# Patient Record
Sex: Male | Born: 1945 | Race: Black or African American | Hispanic: No | Marital: Married | Smoking: Former smoker
Health system: Southern US, Community
[De-identification: ages and names within clinical notes are randomized; demographics above are authoritative.]

## PROBLEM LIST (undated history)

## (undated) DIAGNOSIS — Z86010 Personal history of colonic polyps: Secondary | ICD-10-CM

## (undated) DIAGNOSIS — I70269 Atherosclerosis of native arteries of extremities with gangrene, unspecified extremity: Secondary | ICD-10-CM

## (undated) DIAGNOSIS — I2510 Atherosclerotic heart disease of native coronary artery without angina pectoris: Secondary | ICD-10-CM

## (undated) DIAGNOSIS — N186 End stage renal disease: Secondary | ICD-10-CM

## (undated) DIAGNOSIS — M109 Gout, unspecified: Secondary | ICD-10-CM

## (undated) DIAGNOSIS — N039 Chronic nephritic syndrome with unspecified morphologic changes: Secondary | ICD-10-CM

## (undated) DIAGNOSIS — K922 Gastrointestinal hemorrhage, unspecified: Secondary | ICD-10-CM

## (undated) DIAGNOSIS — I739 Peripheral vascular disease, unspecified: Principal | ICD-10-CM

## (undated) DIAGNOSIS — Z992 Dependence on renal dialysis: Secondary | ICD-10-CM

## (undated) DIAGNOSIS — D631 Anemia in chronic kidney disease: Secondary | ICD-10-CM

## (undated) DIAGNOSIS — I10 Essential (primary) hypertension: Secondary | ICD-10-CM

## (undated) DIAGNOSIS — F4310 Post-traumatic stress disorder, unspecified: Secondary | ICD-10-CM

## (undated) DIAGNOSIS — I4891 Unspecified atrial fibrillation: Secondary | ICD-10-CM

## (undated) DIAGNOSIS — E7800 Pure hypercholesterolemia, unspecified: Secondary | ICD-10-CM

## (undated) DIAGNOSIS — H409 Unspecified glaucoma: Secondary | ICD-10-CM

## (undated) DIAGNOSIS — I350 Nonrheumatic aortic (valve) stenosis: Secondary | ICD-10-CM

## (undated) DIAGNOSIS — D62 Acute posthemorrhagic anemia: Secondary | ICD-10-CM

## (undated) DIAGNOSIS — C61 Malignant neoplasm of prostate: Secondary | ICD-10-CM

## (undated) HISTORY — DX: Unspecified glaucoma: H40.9

## (undated) HISTORY — DX: Gout, unspecified: M10.9

## (undated) HISTORY — DX: Acute posthemorrhagic anemia: D62

## (undated) HISTORY — PX: LEG AMPUTATION ABOVE KNEE: SHX117

## (undated) HISTORY — PX: AORTIC VALVE REPLACEMENT: SHX41

## (undated) HISTORY — DX: End stage renal disease: N18.6

## (undated) HISTORY — DX: Malignant neoplasm of prostate: C61

## (undated) HISTORY — DX: Dependence on renal dialysis: Z99.2

## (undated) HISTORY — DX: Gastrointestinal hemorrhage, unspecified: K92.2

## (undated) HISTORY — DX: Atherosclerotic heart disease of native coronary artery without angina pectoris: I25.10

## (undated) HISTORY — DX: Pure hypercholesterolemia, unspecified: E78.00

## (undated) HISTORY — DX: Post-traumatic stress disorder, unspecified: F43.10

## (undated) HISTORY — DX: Atherosclerosis of native arteries of extremities with gangrene, unspecified extremity: I70.269

## (undated) HISTORY — DX: Nonrheumatic aortic (valve) stenosis: I35.0

## (undated) HISTORY — DX: Anemia in chronic kidney disease: D63.1

## (undated) HISTORY — DX: Peripheral vascular disease, unspecified: I73.9

## (undated) HISTORY — DX: Unspecified atrial fibrillation: I48.91

## (undated) HISTORY — PX: OTHER SURGICAL HISTORY: SHX169

## (undated) HISTORY — DX: Essential (primary) hypertension: I10

## (undated) HISTORY — DX: Personal history of colonic polyps: Z86.010

## (undated) HISTORY — DX: Chronic nephritic syndrome with unspecified morphologic changes: N03.9

---

## 2013-01-29 ENCOUNTER — Non-Acute Institutional Stay (SKILLED_NURSING_FACILITY): Payer: PRIVATE HEALTH INSURANCE | Admitting: Internal Medicine

## 2013-01-29 DIAGNOSIS — N185 Chronic kidney disease, stage 5: Secondary | ICD-10-CM

## 2013-01-29 DIAGNOSIS — I70269 Atherosclerosis of native arteries of extremities with gangrene, unspecified extremity: Secondary | ICD-10-CM

## 2013-01-29 DIAGNOSIS — I4891 Unspecified atrial fibrillation: Secondary | ICD-10-CM

## 2013-02-02 ENCOUNTER — Other Ambulatory Visit: Payer: Self-pay | Admitting: Geriatric Medicine

## 2013-02-02 MED ORDER — FENTANYL 50 MCG/HR TD PT72: 1.0000 | MEDICATED_PATCH | TRANSDERMAL | Status: DC

## 2013-02-04 NOTE — Progress Notes (Signed)
Patient ID: Stephen Dyer, male   DOB: Feb 17, 1946, 67 y.o.   MRN: [id number]           HISTORY & PHYSICAL  DATE:  01/28/2013    FACILITY: [facility]   LEVEL OF CARE:   SNF   CHIEF COMPLAINT:  Admission to SNF, status post admission to [facility], 01/11/2013 through 01/27/2013.    HISTORY OF PRESENT ILLNESS:  This is a 67 year-old man with a history of bilateral carotid stenosis, type 2 diabetes, aortic valve replacement, end-stage renal disease, on peritoneal dialysis, and known PVD, who was admitted for a nonhealing foot ulcer with PAD.  He was status post a very proximal left AKA on April 4th for a nonhealing ulcer.  He developed significant pain in his right foot with linear ulceration on the plantar aspect of the right great toe.  His arteriogram initially suggested the possibility of revascularization.  He had an arteriogram and then was taken to the OR to attempt a femoral to distal bypass.  However, this was aborted intraoperatively since the posterior tibial artery was too calcified.  Tentative discharge plans were to send him to the [place] with hospice care and Dilaudid for better pain control.   However, the [place] does not accommodate any dialysis with hospice patients.  Therefore, the patient was sent to SNF.    PAST MEDICAL HISTORY/PROBLEM LIST:  Advanced peripheral artery disease without any revascularization.    Ischemic wounds involving his right forefoot, especially laterally, the right medial ankle and the previous left above-knee amputation site.    End-stage renal disease, on dialysis.  Had previously been on peritoneal dialysis for four years.    Bilateral carotid stenosis, status post left carotid endarterectomy.    Tobacco abuse.    Claudication.    Aortic stenosis, status post aortic valve replacement.    Hypertension.    Anemia of chronic renal failure.    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     Obstructive sleep apnea, on CPAP.    SOCIAL HISTORY: I have very little information.  The patient's wife is present.    FAMILY HISTORY:   None, according to the patient.    REVIEW OF SYSTEMS:   CHEST/RESPIRATORY:  He is not complaining of shortness of breath.  Ex-smoker, quitting many years ago.   CARDIAC:   No chest pain.  GI:  No abdominal pain.  No diarrhea.   MUSCULOSKELETAL:  Extremities:  Complains of 4/10 pain, mostly in his distal right foot.     PHYSICAL EXAMINATION:   GENERAL APPEARANCE:  Pleasant, cooperative man in no distress.   HEENT:  MOUTH/THROAT:  No lesions seen.  CHEST/RESPIRATORY:  Clear air entry bilaterally.  No wheezing.  Essentially normal exam.   CARDIOVASCULAR:  CARDIAC:   Atrial fibrillation.  There are no murmurs.  No carotid bruits.   GASTROINTESTINAL:  ABDOMEN:   He has a peritoneal dialysis catheter.   LIVER/SPLEEN/KIDNEYS:  No liver, no spleen.  No tenderness.  No masses.   MUSCULOSKELETAL:  EXTREMITIES:   RIGHT LOWER EXTREMITY:  Right leg:  There is an incision in his right groin area and also the medial aspect of his right ankle.  The right groin incision is clean and was stapled.  He does not have a popliteal pulse, but surprisingly I thought I could feel a faint dorsalis pedis pulse.  The incision on the medial aspect of his right  ankle is necrotic.  He has a necrotic eschar on the dorsal aspect of his foot on the right third, fourth and fifth toes that has broken down.  There are ulcers, denuded skin between the fourth and fifth toes.  This is painful.  The medial ankle is also necrotic.  I think the incision, which still has its sutures in place, will eventually become dehisced.   LEFT LOWER EXTREMITY:  The left above-knee amputation site has at least a quarter of the wound covered in a sizeable necrotic eschar.  This will ultimately need to be removed.  Whether this site is salvageable or not really depends on what this looks like underneath.   Stump revision on this site is certainly not impossible.  I do not think there is any active infection at this site or in the right foot, although he is certainly at risk for all of the above.    ASSESSMENT/PLAN:  Chronic renal failure, now on dialysis through the right subclavian.  He has a PICC line in, which I think they were using for antibiotics, which I will maintain for now.   Atrial fibrillation.  Rate is controlled on Coumadin.   Severe peripheral vascular disease with gangrenous change in the right forefoot, right medial ankle, and change in the left amputation site.  This man was  apparently felt to be too unstable for further surgery.  Unfortunately, I think further surgery is going to be necessary at some point, probably to include a right BKA or UKA.  He will need revision of the left stump.  He is not a diabetic.    Hyperlipidemia.  Gastroesophageal reflux disease.    Secondary hyperparathyroidism.    Anemia of chronic renal failure.  On Aranesp.    Probably poorly tolerated hemodialysis.  On Midodrine.    Chronic pain.  On a combination of Fentanyl and hydromorphone.    The issue of hospice has come up on this man.  However, he is Full Code and on dialysis.  I do not see any room for hospice care here.    CPT CODE: 11914

## 2013-02-07 ENCOUNTER — Other Ambulatory Visit: Payer: Self-pay | Admitting: *Deleted

## 2013-02-07 MED ORDER — HYDROMORPHONE HCL 8 MG PO TABS: ORAL_TABLET | ORAL | Status: DC

## 2013-02-11 ENCOUNTER — Other Ambulatory Visit: Payer: Self-pay | Admitting: *Deleted

## 2013-02-11 MED ORDER — FENTANYL 75 MCG/HR TD PT72: MEDICATED_PATCH | TRANSDERMAL | Status: DC

## 2013-02-12 ENCOUNTER — Non-Acute Institutional Stay (SKILLED_NURSING_FACILITY): Payer: PRIVATE HEALTH INSURANCE | Admitting: Internal Medicine

## 2013-02-12 DIAGNOSIS — N185 Chronic kidney disease, stage 5: Secondary | ICD-10-CM

## 2013-02-12 DIAGNOSIS — I70269 Atherosclerosis of native arteries of extremities with gangrene, unspecified extremity: Secondary | ICD-10-CM

## 2013-02-24 NOTE — Progress Notes (Signed)
Patient ID: Stephen Dyer, male   DOB: Aug 30, 1946, 67 y.o.   MRN: [id number]           PROGRESS NOTE  DATE:  02/11/2013  FACILITY: [facility]   LEVEL OF CARE:   SNF   Acute Visit   CHIEF COMPLAINT:  Follow up right foot, left above-knee amputation.    HISTORY OF PRESENT ILLNESS:  Mr. Formisano is a gentleman who came to [place] earlier this month from the [place] in [place].  He has end-stage renal disease, on dialysis now through a right subclavian catheter, previously on peritoneal dialysis.    He has severe PAD with a necrotic wound on his right ankle/lower leg and a failed attempt at revascularization.  He has also had a left above-knee amputation which is covered with a necrotic eschar in the center.    When I saw him two weeks ago, I believe I thought that he was evidently going back to the [place] for further surgical consultation.  As I understand things now, that assumption was wrong.  He is  apparently not planned for any further surgery.  The patient tells me that he had either a postoperative heart attack and some form of hemorrhage when they did the left above-knee amputation.  He also has a large number of comorbidities including bilateral carotid stenosis, coronary artery stenosis, status post aortic valve replacement.      The patient reports increasing pain, currently on Duragesic 50 mcg q.72 as well as Dilaudid 8 mg q.3 which he takes religiously.     REVIEW OF SYSTEMS:   CHEST/RESPIRATORY:  No shortness of breath.  He has oxygen on.   CARDIAC:   No clear chest pain.   GI:  No clear abdominal pain or diarrhea.   MUSCULOSKELETAL:  Increasing pain in the right lower extremity, especially the forefoot.    PHYSICAL EXAMINATION:   SKIN:  INSPECTION:  Since I last saw this gentleman two weeks ago, the condition of his right forefoot has deteriorated.  His right great toe looks as though it is mummified.  There is also I think advancing necrosis through most of his toes, especially  posteriorly.  The area on his right medial lower extremity appears to be stable.  This was an operative site.  It is covered with necrotic eschar with the staples still in place.  This is nowhere close to healing and would need further debridement.    The area on the left above-knee amputation site is also covered with a thick, necrotic eschar which needs further debridement.  Once again, the staples are still in place here.  ASSESSMENT/PLAN:  Gangrene of the right lower extremity.  The situation in his right forefoot appears to have deteriorated in the last two weeks, which is malodorous.  I think the entire area is not going to be easy to deal with and, if this continues at this rate, we may be forced into a difficult decision in the next several weeks.  I think the best option for this would be a right above-knee amputation.  The patient tells me he is not a surgical candidate, although this area may force a decision otherwise.  Failing this, hospice care would certainly be reasonable.  Stopping dialysis at some point would also seem a reasonable decision, +/- hospice care.    With regards to the left above-knee amputation site, there is necrotic eschar here.  I probably could debride this in the facility and see if we can do  anything about healing this.  However, I would like some plan about the right leg before I go to this extent.   I will wait to discuss things further with his wife, who is  apparently returning to the facility as we speak.     CPT CODE: 47829  ADDENDUM:  I think I actually thought this gentleman was returning to the [place] imminently and did not order very much on him.  I will increase his narcotics, Duragesic to 75 q.d.  I will check his lab work.  I would urge a decision here.  I will do my best to facilitate this.

## 2013-03-08 ENCOUNTER — Other Ambulatory Visit: Payer: Self-pay | Admitting: *Deleted

## 2013-03-08 MED ORDER — OXYCODONE HCL 10 MG PO TABS: ORAL_TABLET | ORAL | Status: DC

## 2013-03-10 ENCOUNTER — Non-Acute Institutional Stay (SKILLED_NURSING_FACILITY): Payer: PRIVATE HEALTH INSURANCE | Admitting: Adult Health

## 2013-03-10 DIAGNOSIS — I739 Peripheral vascular disease, unspecified: Secondary | ICD-10-CM

## 2013-03-10 DIAGNOSIS — F17200 Nicotine dependence, unspecified, uncomplicated: Secondary | ICD-10-CM | POA: Insufficient documentation

## 2013-03-10 DIAGNOSIS — H409 Unspecified glaucoma: Secondary | ICD-10-CM

## 2013-03-10 DIAGNOSIS — F4323 Adjustment disorder with mixed anxiety and depressed mood: Secondary | ICD-10-CM | POA: Insufficient documentation

## 2013-03-10 DIAGNOSIS — M109 Gout, unspecified: Secondary | ICD-10-CM

## 2013-03-10 DIAGNOSIS — I359 Nonrheumatic aortic valve disorder, unspecified: Secondary | ICD-10-CM

## 2013-03-10 DIAGNOSIS — E785 Hyperlipidemia, unspecified: Secondary | ICD-10-CM

## 2013-03-10 DIAGNOSIS — Z992 Dependence on renal dialysis: Secondary | ICD-10-CM

## 2013-03-10 DIAGNOSIS — Z860100 Personal history of colon polyps, unspecified: Secondary | ICD-10-CM

## 2013-03-10 DIAGNOSIS — Z952 Presence of prosthetic heart valve: Secondary | ICD-10-CM

## 2013-03-10 DIAGNOSIS — Z954 Presence of other heart-valve replacement: Secondary | ICD-10-CM

## 2013-03-10 DIAGNOSIS — E213 Hyperparathyroidism, unspecified: Secondary | ICD-10-CM

## 2013-03-10 DIAGNOSIS — F4310 Post-traumatic stress disorder, unspecified: Secondary | ICD-10-CM

## 2013-03-10 DIAGNOSIS — C61 Malignant neoplasm of prostate: Secondary | ICD-10-CM

## 2013-03-10 DIAGNOSIS — N186 End stage renal disease: Secondary | ICD-10-CM

## 2013-03-10 DIAGNOSIS — I951 Orthostatic hypotension: Secondary | ICD-10-CM

## 2013-03-10 DIAGNOSIS — I350 Nonrheumatic aortic (valve) stenosis: Secondary | ICD-10-CM

## 2013-03-10 DIAGNOSIS — I10 Essential (primary) hypertension: Secondary | ICD-10-CM

## 2013-03-10 DIAGNOSIS — K219 Gastro-esophageal reflux disease without esophagitis: Secondary | ICD-10-CM

## 2013-03-10 DIAGNOSIS — I679 Cerebrovascular disease, unspecified: Secondary | ICD-10-CM | POA: Insufficient documentation

## 2013-03-10 HISTORY — DX: Essential (primary) hypertension: I10

## 2013-03-10 HISTORY — DX: Personal history of colonic polyps: Z86.010

## 2013-03-10 HISTORY — DX: Peripheral vascular disease, unspecified: I73.9

## 2013-03-10 HISTORY — DX: Unspecified glaucoma: H40.9

## 2013-03-10 HISTORY — DX: Dependence on renal dialysis: Z99.2

## 2013-03-10 HISTORY — DX: Personal history of colon polyps, unspecified: Z86.0100

## 2013-03-10 HISTORY — DX: Malignant neoplasm of prostate: C61

## 2013-03-10 HISTORY — DX: Gout, unspecified: M10.9

## 2013-03-10 HISTORY — DX: Post-traumatic stress disorder, unspecified: F43.10

## 2013-03-10 HISTORY — DX: End stage renal disease: N18.6

## 2013-03-11 ENCOUNTER — Other Ambulatory Visit: Payer: Self-pay | Admitting: Geriatric Medicine

## 2013-03-11 MED ORDER — OXYCODONE HCL 10 MG PO TABS: ORAL_TABLET | ORAL | Status: DC

## 2013-03-14 ENCOUNTER — Non-Acute Institutional Stay (SKILLED_NURSING_FACILITY): Payer: PRIVATE HEALTH INSURANCE | Admitting: Internal Medicine

## 2013-03-14 DIAGNOSIS — N039 Chronic nephritic syndrome with unspecified morphologic changes: Secondary | ICD-10-CM

## 2013-03-14 DIAGNOSIS — I70269 Atherosclerosis of native arteries of extremities with gangrene, unspecified extremity: Secondary | ICD-10-CM

## 2013-03-14 DIAGNOSIS — E7800 Pure hypercholesterolemia, unspecified: Secondary | ICD-10-CM

## 2013-03-14 DIAGNOSIS — D631 Anemia in chronic kidney disease: Secondary | ICD-10-CM

## 2013-03-14 DIAGNOSIS — I350 Nonrheumatic aortic (valve) stenosis: Secondary | ICD-10-CM

## 2013-03-14 DIAGNOSIS — I359 Nonrheumatic aortic valve disorder, unspecified: Secondary | ICD-10-CM

## 2013-03-28 ENCOUNTER — Non-Acute Institutional Stay (SKILLED_NURSING_FACILITY): Payer: PRIVATE HEALTH INSURANCE | Admitting: Internal Medicine

## 2013-03-28 DIAGNOSIS — I4891 Unspecified atrial fibrillation: Secondary | ICD-10-CM

## 2013-03-28 DIAGNOSIS — R110 Nausea: Secondary | ICD-10-CM

## 2013-03-29 ENCOUNTER — Non-Acute Institutional Stay (SKILLED_NURSING_FACILITY): Payer: PRIVATE HEALTH INSURANCE | Admitting: Internal Medicine

## 2013-03-29 DIAGNOSIS — I70269 Atherosclerosis of native arteries of extremities with gangrene, unspecified extremity: Secondary | ICD-10-CM

## 2013-03-29 DIAGNOSIS — N185 Chronic kidney disease, stage 5: Secondary | ICD-10-CM

## 2013-03-29 DIAGNOSIS — I4891 Unspecified atrial fibrillation: Secondary | ICD-10-CM

## 2013-04-07 DIAGNOSIS — E7800 Pure hypercholesterolemia, unspecified: Secondary | ICD-10-CM

## 2013-04-07 DIAGNOSIS — I350 Nonrheumatic aortic (valve) stenosis: Secondary | ICD-10-CM | POA: Insufficient documentation

## 2013-04-07 DIAGNOSIS — I70269 Atherosclerosis of native arteries of extremities with gangrene, unspecified extremity: Secondary | ICD-10-CM

## 2013-04-07 DIAGNOSIS — D631 Anemia in chronic kidney disease: Secondary | ICD-10-CM | POA: Insufficient documentation

## 2013-04-07 DIAGNOSIS — N039 Chronic nephritic syndrome with unspecified morphologic changes: Secondary | ICD-10-CM | POA: Insufficient documentation

## 2013-04-07 HISTORY — DX: Atherosclerosis of native arteries of extremities with gangrene, unspecified extremity: I70.269

## 2013-04-07 HISTORY — DX: Anemia in chronic kidney disease: D63.1

## 2013-04-07 HISTORY — DX: Nonrheumatic aortic (valve) stenosis: I35.0

## 2013-04-07 HISTORY — DX: Pure hypercholesterolemia, unspecified: E78.00

## 2013-04-07 NOTE — Progress Notes (Signed)
Patient ID: Stephen Dyer, male   DOB: 11-Jul-1946, 67 y.o.   MRN: [id number]        HISTORY & PHYSICAL  DATE: 03/14/2013   FACILITY: [facility]  LEVEL OF CARE: SNF (31)  ALLERGIES:   Doxercalciferol.    Gabapentin.    Sulfamethoxazole.  Dilantin.   Sensipar.    Icodextrin.    CHIEF COMPLAINT:  Manage right lower extremity ischemia, aortic stenosis, and hyperlipidemia.    HISTORY OF PRESENT ILLNESS:  The patient is a 67 year-old, African-American male.    RIGHT LOWER EXTREMITY ISCHEMIA:  Patient has severe peripheral arterial disease and is status post left AKA.  He was having critical ischemia of the right lower extremity and failed a bypass attempt.  He was having a nonpalliative surgical wound and dry gangrene.  He started having significant pain and worsening ischemia as well as progression of nonpalliative wound.  Therefore, he eventually underwent right AKA and tolerated the procedure well.  He is admitted to this facility for short-term rehabilitation.  He denies any pain currently.    AORTIC STENOSIS: Status post mechanical aortic valve replacement.  The aortic stenosis remains stable.  Patient denies SOB, DOE, dizziness or syncopal episodes.  Patient is currently being followed by the cardiologist.   HYPERLIPIDEMIA: No complications from the medications presently being used. Last fasting lipid panel showed : A recent lipid panel is not available.    PAST MEDICAL HISTORY :   Aortic valve stenosis, status post mechanical aortic valve replacement.   Hypertension.    Hyperlipidemia.    Gout.   End-stage renal disease, on hemodialysis.   Cerebrovascular disease.   History of prostate cancer.   Posttraumatic stress disorder.   Adjustment disorder with mixed anxiety and depressed mood.    Anemia of chronic kidney disease.    Peripheral vascular disease.   Tobacco use.    Colonic polyps.    Sensorineural hearing loss.    Carotid artery  occlusion.    GERD.   Pre-glaucoma presbyopia.   PAST SURGICAL HISTORY: None  SOCIAL HISTORY: TOBACCO USE:  He used to abuse tobacco, but has quit that practice now.   ALCOHOL:   He used to abuse alcohol, but has quit that practice now.  ILLICIT DRUGS:  He used to abuse illicit drugs, mainly marijuana, but has quit that practice now.  FAMILY HISTORY:  MOTHER/FATHER:  Parents are deceased.   SIBLINGS:  Sister has lupus and diabetes mellitus.    CURRENT MEDICATIONS: Reviewed per [facility]  REVIEW OF SYSTEMS:  See HPI otherwise 14 point ROS is negative.  PHYSICAL EXAMINATION  VS:  T 97.9       P 64      RR 18      BP 100/60      POX 95%        WT (Lb)  GENERAL: no acute distress, normal body habitus EYES: conjunctivae normal, sclerae normal, normal eye lids MOUTH/THROAT: lips without lesions,no lesions in the mouth,tongue is without lesions,uvula elevates in midline NECK: supple, trachea midline, no neck masses, no thyroid tenderness, no thyromegaly LYMPHATICS: no LAN in the neck, no supraclavicular LAN RESPIRATORY: breathing is even & unlabored, BS CTAB CARDIAC: RRR, no murmur,no extra heart sounds, no edema GI:  ABDOMEN: abdomen soft, normal BS, no masses, no tenderness  LIVER/SPLEEN: no hepatomegaly, no splenomegaly MUSCULOSKELETAL: HEAD: normal to inspection & palpation BACK: no kyphosis, scoliosis or spinal processes tenderness EXTREMITIES: LEFT UPPER EXTREMITY: full range of  motion, normal strength & tone RIGHT UPPER EXTREMITY:  full range of motion, normal strength & tone LEFT LOWER EXTREMITY: bilateral AKA RIGHT LOWER EXTREMITY: bilateral AKA PSYCHIATRIC: the patient is alert & oriented to person, affect & behavior appropriate  LABS/RADIOLOGY: Hemoglobin 10.3, MCV 87, otherwise CBC normal.  ASSESSMENT/PLAN:  Right lower extremity ischemia.  Status post right AKA.  Continue wound treatment.    Aortic stenosis.  Status post aortic valve replacement.     Hyperlipidemia.  Continue statin.   Anemia of chronic kidney disease.  Reassess hemoglobin level.    End-stage renal disease.  Continue hemodialysis.    Gout.  Continue allopurinol.    Check CBC and BMP.   I have reviewed patient's medical records received at admission/from hospitalization.  CPT CODE: 95284

## 2013-04-11 ENCOUNTER — Non-Acute Institutional Stay (SKILLED_NURSING_FACILITY): Payer: PRIVATE HEALTH INSURANCE | Admitting: Adult Health

## 2013-04-11 DIAGNOSIS — N186 End stage renal disease: Secondary | ICD-10-CM

## 2013-04-11 DIAGNOSIS — Z952 Presence of prosthetic heart valve: Secondary | ICD-10-CM

## 2013-04-11 DIAGNOSIS — Z992 Dependence on renal dialysis: Secondary | ICD-10-CM

## 2013-04-11 DIAGNOSIS — I350 Nonrheumatic aortic (valve) stenosis: Secondary | ICD-10-CM

## 2013-04-11 DIAGNOSIS — Z954 Presence of other heart-valve replacement: Secondary | ICD-10-CM

## 2013-04-11 DIAGNOSIS — E7800 Pure hypercholesterolemia, unspecified: Secondary | ICD-10-CM

## 2013-04-11 DIAGNOSIS — E213 Hyperparathyroidism, unspecified: Secondary | ICD-10-CM

## 2013-04-11 DIAGNOSIS — I4891 Unspecified atrial fibrillation: Secondary | ICD-10-CM

## 2013-04-11 DIAGNOSIS — I359 Nonrheumatic aortic valve disorder, unspecified: Secondary | ICD-10-CM

## 2013-04-11 DIAGNOSIS — R634 Abnormal weight loss: Secondary | ICD-10-CM

## 2013-04-11 DIAGNOSIS — M109 Gout, unspecified: Secondary | ICD-10-CM

## 2013-04-11 DIAGNOSIS — I951 Orthostatic hypotension: Secondary | ICD-10-CM

## 2013-04-11 DIAGNOSIS — I739 Peripheral vascular disease, unspecified: Secondary | ICD-10-CM

## 2013-04-19 NOTE — Progress Notes (Signed)
Patient ID: Stephen Dyer, male   DOB: 1946/05/07, 67 y.o.   MRN: [id number]           HISTORY & PHYSICAL  DATE:  03/29/2013    [facility]ACILITY[facility]   LEVEL OF CARE:   SNF   Acute Visit   CHIEF COMPLAINT:  Review of lower extremity wounds, etc.    HISTORY OF PRESENT ILLNESS:  Mr. Nepomuceno is a gentleman who came to [place] initially from [facility].  He had had an attempt at revascularization of a gangrenous change in his left leg.  He also had a necrotic area on a previous left above-knee amputation.  We were eventually able to send him back to the [place] in [place] for a right above-knee amputation and revision of the left stump.  I do not have much information on this.  However, he appears to have been readmitted to the facility on 03/14/2013.    He was readmitted to hospital on 03/17/2013 with atrial fibrillation with rapid ventricular response.  The patient received a beta blocker with good improvement in his heart rate.  He received oral ciprofloxacin for a stump culture that grew gram-negative rods.    PAST MEDICAL HISTORY:  Peripheral vascular disease, now with bilateral above-knee amputations, right above-knee amputation after the left and revision of the left stump.    End-stage renal disease, on hemodialysis through a right subclavian line, having previously been on peritoneal dialysis for years.    Bilateral carotid stenosis, status post left carotid endarterectomy.    History of severe PAD.    Aortic stenosis, status post aortic valve replacement which I think is prosthetic.    Hypertension.    Anemia of chronic renal failure.    Obstructive sleep apnea, on CPAP.    CURRENT MEDICATIONS:  Medication list is reviewed.    His INR today is 1.8.  He is on Coumadin 6.5 mg.  There are orders from his nephrologist, as well.    REVIEW OF SYSTEMS:   CHEST/RESPIRATORY:  He is not complaining of shortness of breath.  On chronic oxygen.  He is an ex-smoker,  quitting many years ago.   CARDIAC:   No current chest pain.  GI:  No abdominal pain.  No diarrhea.  MUSCULOSKELETAL:  Status post bilateral above-knee amputations now.     PHYSICAL EXAMINATION:   VITAL SIGNS:   O2 SATURATIONS:  Pulse ox 91% on 2 L.  RESPIRATIONS:  18.   PULSE:  100.   CHEST/RESPIRATORY:  Shallow, but otherwise clear air entry.  CARDIOVASCULAR:  CARDIAC:  AFib.  There is a 2/6 mitral regurge type murmur at the lower left sternal border that does not radiate.  This could also be tricuspid regurge.   GASTROINTESTINAL:  ABDOMEN:   He has a peritoneal dialysis catheter in place.   LIVER/SPLEEN/KIDNEYS:  No liver, no spleen.  No tenderness.   SKIN:  INSPECTION:   Extremities:  I did look at his left stump wound which is much better, a clean granulating wound.  Silver alginate to this.    ASSESSMENT/PLAN:  Atrial fibrillation.  His pulse rate is in the high 90s.  It does not appear that he is on any medications for this.  States he was on a beta blocker at [facility].  I do not see this.  He is already on Coumadin, followed in-house.    History of end-stage renal disease, on hemodialysis currently through a right subclavian line.  History of now bilateral lower extremity amputations.  I will review both of his stumps later this week.  However, the necrotic wound on the left stump, I think has been revised and now looks on its way to healing.    Status post aortic valve replacement.  There is no evidence of heart failure here.  I suspect this is a tissue valve.  I wonder whether he had some degree of pulmonary hypertension as the second sound was fairly brisk.    He is on midodrine.  I wonder how well he tolerates this.    I will be back later in the week to check the wounds on his lower extremities.    CPT CODE: 16109

## 2013-04-20 NOTE — Progress Notes (Signed)
Patient ID: Stephen Dyer, male   DOB: 31-Jan-1946, 67 y.o.   MRN: [id number]        PROGRESS NOTE  DATE:  03/28/2013  FACILITY:  [facility]  LEVEL OF CARE: SNF (31)  Acute Visit  CHIEF COMPLAINT:  Manage nausea and tachycardia.    HISTORY OF PRESENT ILLNESS: I was requested by the staff to assess the patient regarding above problem(s):  NAUSEA:  Staff report that patient is having frequent nausea, and patient does admit to nausea in the evenings and at night.  He denies vomiting, diarrhea, or constipation.    He cannot identify precipitating factors.  Alleviated by Zofran.    TACHYCARDIA:  Patient is complaining of frequent elevated heart rate, but denies shortness of breath or chest pain.  On review of pulse log, patient only has one elevation to 111 on 03/24/2013.  Other times, pulse is normal.    PAST MEDICAL HISTORY : Reviewed.  No changes.  CURRENT MEDICATIONS: Reviewed per [facility]  REVIEW OF SYSTEMS:  GENERAL: no change in appetite, no fatigue, no weight changes, no fever, chills or weakness RESPIRATORY: no cough, SOB, DOE,, wheezing, hemoptysis CARDIAC: no chest pain, edema or palpitations GI: no abdominal pain, diarrhea, constipation, heart burn, or vomiting; complains of nausea  PHYSICAL EXAMINATION  VS:  T 98.2       P 76      RR 16      BP 122/68     POX %       WT (Lb)  GENERAL: no acute distress, normal body habitus NECK: supple, trachea midline, no neck masses, no thyroid tenderness, no thyromegaly RESPIRATORY: breathing is even & unlabored, BS CTAB CARDIAC: RRR, no murmur,no extra heart sounds, no edema GI: abdomen soft, normal BS, no masses, no tenderness, no hepatomegaly, no splenomegaly PSYCHIATRIC: the patient is alert & oriented to person, affect & behavior appropriate  ASSESSMENT/PLAN:  Nausea.  New problem.  Start Zofran 4 mg  t.i.d. p.r.n.   Atrial fibrillation.  Rate controlled.  No persistent tachycardia.      CPT CODE: 40981

## 2013-04-21 DIAGNOSIS — R110 Nausea: Secondary | ICD-10-CM | POA: Insufficient documentation

## 2013-04-21 DIAGNOSIS — I4891 Unspecified atrial fibrillation: Secondary | ICD-10-CM | POA: Insufficient documentation

## 2013-04-21 HISTORY — DX: Unspecified atrial fibrillation: I48.91

## 2013-05-31 ENCOUNTER — Other Ambulatory Visit: Payer: Self-pay | Admitting: *Deleted

## 2013-05-31 MED ORDER — OXYCODONE HCL 10 MG PO TABS: ORAL_TABLET | ORAL | Status: DC

## 2013-06-15 ENCOUNTER — Other Ambulatory Visit: Payer: Self-pay | Admitting: *Deleted

## 2013-06-15 MED ORDER — OXYCODONE HCL 10 MG PO TABS: ORAL_TABLET | ORAL | Status: DC

## 2013-06-16 ENCOUNTER — Non-Acute Institutional Stay (SKILLED_NURSING_FACILITY): Payer: PRIVATE HEALTH INSURANCE | Admitting: Internal Medicine

## 2013-06-16 DIAGNOSIS — I4891 Unspecified atrial fibrillation: Secondary | ICD-10-CM

## 2013-06-16 DIAGNOSIS — I2510 Atherosclerotic heart disease of native coronary artery without angina pectoris: Secondary | ICD-10-CM

## 2013-06-16 DIAGNOSIS — K922 Gastrointestinal hemorrhage, unspecified: Secondary | ICD-10-CM

## 2013-06-16 DIAGNOSIS — D62 Acute posthemorrhagic anemia: Secondary | ICD-10-CM

## 2013-06-27 ENCOUNTER — Non-Acute Institutional Stay (SKILLED_NURSING_FACILITY): Payer: PRIVATE HEALTH INSURANCE | Admitting: Internal Medicine

## 2013-06-27 DIAGNOSIS — D62 Acute posthemorrhagic anemia: Secondary | ICD-10-CM

## 2013-07-02 DIAGNOSIS — D62 Acute posthemorrhagic anemia: Secondary | ICD-10-CM

## 2013-07-02 HISTORY — DX: Acute posthemorrhagic anemia: D62

## 2013-07-02 NOTE — Progress Notes (Signed)
PROGRESS NOTE  DATE: 06/27/2013  FACILITY:  [facility]  LEVEL OF CARE: SNF (31)  Acute Visit  CHIEF COMPLAINT:  Manage  HISTORY OF PRESENT ILLNESS: I was requested by the staff to assess the patient regarding above problem(s):  ANEMIA: The anemia has been stable. The patient denies fatigue, melena or hematochezia.  And 06-23-13 hemoglobin 9, MCV 90. Prior hemoglobin was 9.1.  PAST MEDICAL HISTORY : Reviewed.  No changes.  CURRENT MEDICATIONS: Reviewed per [facility]  PHYSICAL EXAMINATION  GENERAL: no acute distress, normal body habitus RESPIRATORY: breathing is even & unlabored, BS CTAB CARDIAC: RRR, no murmur,no extra heart sounds, no edema  LABS/RADIOLOGY: See history of present illness  ASSESSMENT/PLAN:  Acute blood loss anemia -- hemoglobin stable.  CPT CODE: 86578

## 2013-07-08 ENCOUNTER — Other Ambulatory Visit: Payer: Self-pay | Admitting: *Deleted

## 2013-07-08 MED ORDER — OXYCODONE HCL 10 MG PO TABS: ORAL_TABLET | ORAL | Status: DC

## 2013-07-08 NOTE — Telephone Encounter (Signed)
rx filled per protocol  

## 2013-07-19 DIAGNOSIS — I2510 Atherosclerotic heart disease of native coronary artery without angina pectoris: Secondary | ICD-10-CM | POA: Insufficient documentation

## 2013-07-19 DIAGNOSIS — K922 Gastrointestinal hemorrhage, unspecified: Secondary | ICD-10-CM | POA: Insufficient documentation

## 2013-07-19 HISTORY — DX: Atherosclerotic heart disease of native coronary artery without angina pectoris: I25.10

## 2013-07-19 HISTORY — DX: Gastrointestinal hemorrhage, unspecified: K92.2

## 2013-07-19 NOTE — Progress Notes (Signed)
Patient ID: Stephen Dyer, male   DOB: 02-Sep-1946, 67 y.o.   MRN: [id number]        HISTORY & PHYSICAL  DATE: 06/16/2013   FACILITY: [facility]  LEVEL OF CARE: SNF (31)  ALLERGIES:   Dilantin.    Losartan.    Hydrochlorothiazide.    Sulfa.    Vancomycin.    Atorvastatin.    Gabapentin.    Sensipar.    Hectorol.    Icodextrin.    CHIEF COMPLAINT:  Manage acute GI bleed, atrial fibrillation, and CAD.    HISTORY OF PRESENT ILLNESS:  The patient is a 67 year-old, African-American male who was hospitalized secondary to acute GI bleed.  He was supratherapeutic on his INR and was hemoccult positive.  EGD showed Mallory-Weiss tear at the GE junction with no signs of active bleeding.    He required 4 U of packed red blood cells.  Hemoglobin stabilized at 9.1.  The patient is readmitted back to the facility for short-term rehabilitation.  He denies ongoing GI bleeding.    ATRIAL FIBRILLATION: the patients atrial fibrillation remains stable.  The patient denies DOE, tachycardia, orthopnea, transient neurological sx, pedal edema, palpitations, & PNDs.  No complications noted from the medications currently being used.    CAD: The angina has been stable. The patient denies dyspnea on exertion, orthopnea, pedal edema, palpitations and paroxysmal nocturnal dyspnea. No complications noted from the medication presently being used.    PAST MEDICAL HISTORY :   Atrial fibrillation.    Aortic valve replacement with a [employer] mechanical valve.    CAD.    Anemia of chronic disease.    End-stage renal disease, on hemodialysis.    Hyperlipidemia.    Gout.    PAST SURGICAL HISTORY:  Bilateral AKA.     Aortic valve replacement.    SOCIAL HISTORY: TOBACCO USE:  No current tobacco use.  ALCOHOL:  No current alcohol use.  ILLICIT DRUGS:  No current illicit drug use.   HOUSING:   He was a resident of this facility prior to hospitalization.    FAMILY HISTORY:    Hypertension.    CURRENT MEDICATIONS: Reviewed per [facility]  REVIEW OF SYSTEMS:   GI:  Complains of constipation.    See HPI otherwise 14 point ROS is negative.  PHYSICAL EXAMINATION  VS:  T 96.8       P 68      RR 18      BP 90/74      POX 96%        WT (Lb) 148.5    GENERAL: no acute distress, normal body habitus EYES: conjunctivae normal, sclerae normal, normal eye lids MOUTH/THROAT: lips without lesions,no lesions in the mouth,tongue is without lesions,uvula elevates in midline NECK: supple, trachea midline, no neck masses, no thyroid tenderness, no thyromegaly LYMPHATICS: no LAN in the neck, no supraclavicular LAN RESPIRATORY: breathing is even & unlabored, BS CTAB CARDIAC: heart rate is irregular irregular, no murmur,no extra heart sounds, no edema GI:  ABDOMEN: abdomen soft, normal BS, no masses, no tenderness  LIVER/SPLEEN: no hepatomegaly, no splenomegaly MUSCULOSKELETAL: HEAD: normal to inspection & palpation BACK: no kyphosis, scoliosis or spinal processes tenderness EXTREMITIES: LEFT UPPER EXTREMITY: full range of motion, normal strength & tone RIGHT UPPER EXTREMITY:  full range of motion, normal strength & tone LEFT LOWER EXTREMITY:  AKA   RIGHT LOWER EXTREMITY: AKA   PSYCHIATRIC: the patient is alert & oriented to person, affect & behavior  appropriate  LABS/RADIOLOGY: Serum iron level 57, TIBC 53, percent saturation 23, vitamin B12 497, RBC folate 1,439.    Hemoglobin A1c 5.3.    WBC 8.1, hemoglobin 9.1.    Sodium 133, potassium 4.1, BUN 47, creatinine 8.17.    INR 3.11.    ASSESSMENT/PLAN:  Acute GI bleed secondary to supratherapeutic INR.  No active bleeding.  Continue PPI.    Acute blood loss anemia.  Status post transfusion.  Reassess.    Atrial fibrillation.  Rate controlled.    CAD.  Stable.    Constipation.  New problem.  Start MiraLAX 17 g q.d.    End-stage renal disease.  On hemodialysis.    Check CBC and BMP.    I have reviewed  patient's medical records received at admission/from hospitalization.  CPT CODE: 16109

## 2013-07-26 ENCOUNTER — Non-Acute Institutional Stay (SKILLED_NURSING_FACILITY): Payer: PRIVATE HEALTH INSURANCE | Admitting: Internal Medicine

## 2013-07-26 DIAGNOSIS — I4891 Unspecified atrial fibrillation: Secondary | ICD-10-CM

## 2013-07-26 DIAGNOSIS — I2510 Atherosclerotic heart disease of native coronary artery without angina pectoris: Secondary | ICD-10-CM

## 2013-07-26 DIAGNOSIS — E7800 Pure hypercholesterolemia, unspecified: Secondary | ICD-10-CM

## 2013-07-26 DIAGNOSIS — D631 Anemia in chronic kidney disease: Secondary | ICD-10-CM

## 2013-07-26 NOTE — Progress Notes (Signed)
PROGRESS NOTE  DATE: 07/26/2013  FACILITY: Nursing Home Location: [facility]  LEVEL OF CARE: SNF (31)  Routine Visit  CHIEF COMPLAINT:  Manage atrial fibrillation, CAD and hyperlipidemia  HISTORY OF PRESENT ILLNESS:  REASSESSMENT OF ONGOING PROBLEM(S):  ATRIAL FIBRILLATION: the patients atrial fibrillation remains stable.  The patient denies DOE, tachycardia, orthopnea, transient neurological sx, pedal edema, palpitations, & PNDs.  No complications noted from the medications currently being used.  CAD: The angina has been stable. The patient denies dyspnea on exertion, orthopnea, pedal edema, palpitations and paroxysmal nocturnal dyspnea. No complications noted from the medication presently being used.  HYPERLIPIDEMIA: No complications from the medications presently being used. Last fasting lipid panel showed : in 9-14 HDL 25 otherwise fasting lipid panel normal  PAST MEDICAL HISTORY : Reviewed.  No changes.  CURRENT MEDICATIONS: Reviewed per [facility]  REVIEW OF SYSTEMS:  GENERAL: no change in appetite, no fatigue, no weight changes, no fever, chills or weakness RESPIRATORY: no cough, SOB, DOE, wheezing, hemoptysis CARDIAC: no chest pain, edema or palpitations GI: no abdominal pain, diarrhea, constipation, heart burn, nausea or vomiting  PHYSICAL EXAMINATION  VS:  T 98.5       P 62     RR 20      BP 126/92     POX %     WT (Lb) 149  GENERAL: no acute distress, normal body habitus EYES: conjunctivae normal, sclerae normal, normal eye lids NECK: supple, trachea midline, no neck masses, no thyroid tenderness, no thyromegaly LYMPHATICS: no LAN in the neck, no supraclavicular LAN RESPIRATORY: breathing is even & unlabored, BS CTAB CARDIAC: Heart rate is irregularly irregular, no murmur,no extra heart sounds GI: abdomen soft, normal BS, no masses, no tenderness, no hepatomegaly, no splenomegaly PSYCHIATRIC: the patient is alert & oriented to person, affect &  behavior appropriate  LABS/RADIOLOGY:  10- 14 hemoglobin 9, MCV 90 otherwise CBC normal, BUN 37, creatinine 4.67 otherwise BMP normal  ASSESSMENT/PLAN:  Atrial fibrillation-rate controlled CAD-stable Hyperlipidemia -- well-controlled Anemia of chronic kidney disease-stable GERD-stable Constipation-well-controlled End-stage renal disease-on hemodialysis Check liver profile  CPT CODE: 11914

## 2013-08-02 ENCOUNTER — Non-Acute Institutional Stay (SKILLED_NURSING_FACILITY): Payer: PRIVATE HEALTH INSURANCE | Admitting: Internal Medicine

## 2013-08-02 DIAGNOSIS — N186 End stage renal disease: Secondary | ICD-10-CM

## 2013-08-02 DIAGNOSIS — I4891 Unspecified atrial fibrillation: Secondary | ICD-10-CM

## 2013-08-02 DIAGNOSIS — I2510 Atherosclerotic heart disease of native coronary artery without angina pectoris: Secondary | ICD-10-CM

## 2013-08-02 DIAGNOSIS — D631 Anemia in chronic kidney disease: Secondary | ICD-10-CM

## 2013-08-29 ENCOUNTER — Encounter: Payer: Self-pay | Admitting: Adult Health

## 2013-08-29 NOTE — Progress Notes (Signed)
Patient ID: Stephen Dyer, male   DOB: [birth year], 67 y.o.   MRN: [id number]     [facility]  Allergies  Allergen Reactions  . Niacin And Related   . Nicotine   . Sulfa Antibiotics   . Zetia [Ezetimibe]     Chief Complaint  Patient presents with  . Hospitalization Follow-up    HPI  He has been hospitalized for ischemic right lower extremity with right aka (new) ; debridement of old  left aka; esrd on hemodialysis. He is a long term resident of skilled nursing. There are no concerns being voiced by the nursing staff at this time. He is complaining of right aka pain and he has scrotal edema present.    Past Medical History  Diagnosis Date  . Aortic stenosis   . Atherosclerotic peripheral vascular disease with gangrene   . Atrial fibrillation   . Coronary atherosclerosis of native coronary artery   . Acute lower gastrointestinal bleeding   . Acute posthemorrhagic anemia   . Anemia in chronic kidney disease(285.21)   . Pure hypercholesterolemia   . Essential hypertension, benign   . Gout   . End stage renal disease on dialysis   . Prostate cancer   . PVD (peripheral vascular disease)   . Personal history of colonic polyps   . Glaucoma   . Post traumatic stress disorder (PTSD)      Past Surgical History  Procedure Laterality Date  . Aortic valve replacement    . Leg amputation above knee Bilateral     left then right  . Debriderment of aka Left   . Rij      tunneled     Filed Vitals:   03/10/13 1441  BP: 110/62  Pulse: 70  Height: 5\' 11"  (1.803 m)  Weight: 163 lb (73.936 kg)    MEDICATIONS  Oxycodone 10 mg every 4 hours as needed Coumadin 7.5 mg daily Allopurinol 100 mg daily calcitriol  0.25 mcg daily fosrenal 100 mg 2 tabs tid protonix 40 mg daily Procaltrol  12 mcg daily pravachol 40 mg daily sevelamer 800 mg (3) tid midorine 10 mg daily; 1200 cc fluid restriction  SIGNIFICANT STUDIES:   03-09-13: chest x-ray: borderline heart size; early  changes of chf; mild bibasilar atelectasis and or infiltrate   LABS REVIEWED:   03-06-13: wbc 6.5;hgb 10.3; hct 32.1; mcv 87; plt 241;   Review of Systems  Constitutional: Negative for malaise/fatigue.  Eyes: Negative for blurred vision.  Respiratory: Negative for cough, shortness of breath and wheezing.   Cardiovascular: Negative for chest pain and palpitations.  Gastrointestinal: Negative for heartburn, abdominal pain and constipation.  Genitourinary:       Has scrotal edema present   Musculoskeletal: Positive for joint pain and myalgias.       Has right stump pain  Skin: Negative for itching.  Neurological: Negative for headaches.  Psychiatric/Behavioral: Negative for depression. The patient does not have insomnia.    Physical Exam  Constitutional: He is oriented to person, place, and time. He appears well-developed and well-nourished. No distress.  Neck: Neck supple. No JVD present.  Cardiovascular: Normal rate and regular rhythm.   Respiratory: Effort normal and breath sounds normal. No respiratory distress. He has no wheezes.  GI: Soft. Bowel sounds are normal. He exhibits no distension. There is no tenderness.  Genitourinary:  Has scrotal edema   Musculoskeletal:  Bilateral aka  Neurological: He is alert and oriented to person, place, and time.  Skin: Skin is warm  and dry. He is not diaphoretic.  Right stump: suture line intact; small amount slough present medial edge  Left stump: no necrotic tissue present small amount drainage present no signs of infection present.   Right chest dialysis access  Psychiatric: He has a normal mood and affect.      ASSESSMENT/PLAN  1. Pvd; requiring second aka and debridement of left aka; due to his pain will change his oxycodone to 10 mg every 3 hours as needed and will monitor his status.   2. Hyperparathyroidism: is stable is followed by nephrology; will continue paricalcitol 12 mcg daily and will monitor  3. ESRD: is on  hemodialysis three days per week; will continue 1200 cc fluid restriction; will continue calcitrol 25 mcg daily; fosrenal 100 mg 2 tabs three times daily; sevelamer 2400 mg three times daily and will monitor  4. Gout will continue allopurinol 100 mg daily no recent flares present   5. gerd will continue protonix 40 mg daily  6. Dyslipidemia: will continue prvachol 40 mg daily  7. Aortic stenosis status post replacement: will continue coumadin therapy which is managed by pharmacy; will monitor his status.   8. Orthostatic hypotension: will continue midodrine 10 mg daily and will monitor    Time spent with patient 50 minutes.

## 2013-08-30 NOTE — Progress Notes (Signed)
Patient ID: Stephen Dyer, male   DOB: [birth year], 67 y.o.   MRN: [id number]     [facility]  Allergies  Allergen Reactions  . Niacin And Related   . Nicotine   . Sulfa Antibiotics   . Zetia [Ezetimibe]     Chief Complaint  Patient presents with  . Medical Managment of Chronic Issues   HPI he is being seen for the management of his chronic illnesses. There are no concerns being voiced by the nursing staff at this time. He is not voicing any concerns today. His status is stable at this time.   Past Medical History  Diagnosis Date  . Aortic stenosis 04/07/2013  . Atherosclerotic peripheral vascular disease with gangrene 04/07/2013  . Atrial fibrillation 04/21/2013  . Coronary atherosclerosis of native coronary artery 07/19/2013  . Acute lower gastrointestinal bleeding 07/19/2013  . Acute posthemorrhagic anemia 07/02/2013  . 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 Pure hypercholesterolemia 04/07/2013  . Essential hypertension, benign 03/10/2013  . Gout 03/10/2013  . End stage renal disease on dialysis 03/10/2013  . Prostate cancer 03/10/2013  . PVD (peripheral vascular disease) 03/10/2013  . Personal history of colonic polyps 03/10/2013  . Glaucoma 03/10/2013  . Post traumatic stress disorder (PTSD) 03/10/2013    Past Surgical History  Procedure Laterality Date  . Aortic valve replacement    . Leg amputation above knee Bilateral     left then right  . Debriderment of aka Left   . Rij      tunneled     Filed Vitals:   04/11/13 1525  BP: 102/64  Pulse: 82  Height: 5\' 11"  (1.803 m)  Weight: 153 lb (69.4 kg)    MEDICATIONS  Oxycodone 10 mg every 4 hours as needed Coumadin 7.5 mg daily calcitriol  0.25 mcg daily protonix 40 mg daily pravachol 40 mg daily sevelamer 800 mg (3) tid midorine 5 mg daily;  1200 cc fluid restriction toprol xl 25 mg daily  02 2 liters Megace 200 mg daily      SIGNIFICANT STUDIES:   03-09-13: chest x-ray: borderline heart  size; early changes of chf; mild bibasilar atelectasis and or infiltrate   LABS REVIEWED:   03-06-13: wbc 6.5;hgb 10.3; hct 32.1; mcv 87; plt 241;   Review of Systems  Constitutional: Negative for malaise/fatigue.  Eyes: Negative for blurred vision.  Respiratory: Negative for cough, shortness of breath and wheezing.   Cardiovascular: Negative for chest pain and palpitations.  Gastrointestinal: Negative for heartburn, abdominal pain and constipation.  Musculoskeletal: Positive for joint pain and myalgias.       Has right stump pain is managed   Skin: Negative for itching.  Neurological: Negative for headaches.  Psychiatric/Behavioral: Negative for depression. The patient does not have insomnia.    Physical Exam  Constitutional: He is oriented to person, place, and time. He appears well-developed and well-nourished. No distress.  Neck: Neck supple. No JVD present.  Cardiovascular: Normal rate and regular rhythm.   Respiratory: Effort normal and breath sounds normal. No respiratory distress. He has no wheezes.  GI: Soft. Bowel sounds are normal. He exhibits no distension. There is no tenderness.   Musculoskeletal:  Bilateral aka  Neurological: He is alert and oriented to person, place, and time.  Skin: Skin is warm and dry. He is not diaphoretic.  Right chest dialysis access  Psychiatric: He has a normal mood and affect.      ASSESSMENT/PLAN  1. Pvd; requiring second aka and debridement  of left aka; due to his pain will change his oxycodone to 10 mg every 3 hours as needed and will monitor his status.   2. Hyperparathyroidism: is stable is followed by nephrology will monitor  3. ESRD: is on hemodialysis three days per week; will continue 1200 cc fluid restriction;  daily and will monitor  4. Gout  no recent flares present will monitor  5. gerd will continue protonix 40 mg daily  6. Dyslipidemia: will continue prvachol 40 mg daily  7. Aortic stenosis status post  replacement: will continue coumadin therapy which is managed by pharmacy; will monitor his status.   8. Orthostatic hypotension: will continue midodrine 5 mg daily and will monitor   9. Afib: is on chronic coumadin therapy which is managed by pharmacy; is on toprol xl 25 mg daily for rate control.   10. Weight loss: is on megace 200 mg daily will not make changes will mointor

## 2013-09-02 ENCOUNTER — Other Ambulatory Visit: Payer: Self-pay

## 2013-09-02 MED ORDER — OXYCODONE-ACETAMINOPHEN 5-325 MG PO TABS: 1.0000 | ORAL_TABLET | ORAL | Status: DC | PRN

## 2013-09-02 NOTE — Telephone Encounter (Signed)
RX reprinted under Stephen Dyer's name (Man X and Stephen Dyer switched on-call days) 

## 2013-09-02 NOTE — Addendum Note (Signed)
Addended by: Maurice Small on: 09/02/2013 03:07 PM   Modules accepted: Orders

## 2013-09-06 ENCOUNTER — Encounter: Payer: Self-pay | Admitting: Internal Medicine

## 2013-09-06 ENCOUNTER — Non-Acute Institutional Stay (SKILLED_NURSING_FACILITY): Payer: PRIVATE HEALTH INSURANCE | Admitting: Internal Medicine

## 2013-09-06 DIAGNOSIS — I150 Renovascular hypertension: Secondary | ICD-10-CM

## 2013-09-06 DIAGNOSIS — M109 Gout, unspecified: Secondary | ICD-10-CM

## 2013-09-06 DIAGNOSIS — I2510 Atherosclerotic heart disease of native coronary artery without angina pectoris: Secondary | ICD-10-CM

## 2013-09-06 DIAGNOSIS — I4891 Unspecified atrial fibrillation: Secondary | ICD-10-CM

## 2013-09-06 NOTE — Progress Notes (Signed)
Patient ID: Stephen Dyer, male   DOB: 10-10-1945, 67 y.o.   MRN: [id number]        HISTORY & PHYSICAL  DATE: 09/06/2013   FACILITY: [facility]  LEVEL OF CARE: SNF (31)  ALLERGIES:   Dilantin.    Losartan.    Hydrochlorothiazide.    Sulfa.    Vancomycin.    Atorvastatin.    Gabapentin.    Sensipar.    Hectorol.    Icodextrin.    CHIEF COMPLAINT:  Manage [place] vascular hypertension, atrial fibrillation, and CAD.    HISTORY OF PRESENT ILLNESS:  The patient is a 67 year-old, African-American male who was hospitalized for revision of AV fistula on left brachiocephalic.  The patient is readmitted back to the facility for short-term rehabilitation.    ATRIAL FIBRILLATION: the patients atrial fibrillation remains stable.  The patient denies DOE, tachycardia, orthopnea, transient neurological sx, pedal edema, palpitations, & PNDs.  No complications noted from the medications currently being used.    CAD: The angina has been stable. The patient denies dyspnea on exertion, orthopnea, pedal edema, palpitations and paroxysmal nocturnal dyspnea. No complications noted from the medication presently being used.    HTN: Pt 's HTN remains stable.  Denies CP, sob, DOE, pedal edema, headaches, dizziness or visual disturbances.  No complications from the medications currently being used.  Last BP : 102/74  PAST MEDICAL HISTORY :   Atrial fibrillation.    Aortic valve replacement with a [employer] mechanical valve.    CAD.    Anemia of chronic disease.    End-stage renal disease, on hemodialysis.    Hyperlipidemia.    Gout.    PAST SURGICAL HISTORY:  Bilateral AKA.     Aortic valve replacement.    SOCIAL HISTORY: TOBACCO USE:  No current tobacco use.  ALCOHOL:  No current alcohol use.  ILLICIT DRUGS:  No current illicit drug use.   HOUSING:   He was a resident of this facility prior to hospitalization.    FAMILY HISTORY:   Hypertension.    CURRENT  MEDICATIONS: Reviewed per [facility]  REVIEW OF SYSTEMS: See HPI otherwise 14 point ROS is negative.  PHYSICAL EXAMINATION  VS:  T 96       P 60     RR 18      BP 102/74      POX 95%        WT (Lb) 150    GENERAL: no acute distress, normal body habitus EYES: conjunctivae normal, sclerae normal, normal eye lids MOUTH/THROAT: lips without lesions,no lesions in the mouth,tongue is without lesions,uvula elevates in midline NECK: supple, trachea midline, no neck masses, no thyroid tenderness, no thyromegaly LYMPHATICS: no LAN in the neck, no supraclavicular LAN RESPIRATORY: breathing is even & unlabored, BS CTAB CARDIAC: heart rate is irregular irregular, no murmur,no extra heart sounds, no edema GI:  ABDOMEN: abdomen soft, normal BS, no masses, no tenderness  LIVER/SPLEEN: no hepatomegaly, no splenomegaly MUSCULOSKELETAL: HEAD: normal to inspection & palpation BACK: no kyphosis, scoliosis or spinal processes tenderness EXTREMITIES: LEFT UPPER EXTREMITY: full range of motion, normal strength & tone RIGHT UPPER EXTREMITY:  full range of motion, normal strength & tone LEFT LOWER EXTREMITY:  AKA   RIGHT LOWER EXTREMITY: AKA   PSYCHIATRIC: the patient is alert & oriented to person, affect & behavior appropriate  LABS/RADIOLOGY:  08-31-13 hemoglobin 8.2, MCV 86.4 otherwise CBC normal, creatinine 8.8, BUN 58, chloride 98 otherwise BMP normal  11-14 BUN  55, creatinine 7.16, potassium greater than 10, chloride 95 otherwise CMP normal 9-14 Serum iron level 57, TIBC 53, percent saturation 23, vitamin B12 497, RBC folate 1,439.    Hemoglobin A1c 5.3.    WBC 8.1, hemoglobin 9.1.    Sodium 133, potassium 4.1, BUN 47, creatinine 8.17.    INR 3.11.    ASSESSMENT/PLAN:   Atrial fibrillation.  Rate controlled.    CAD.  Stable.    [place] vascular hypertension-well controlled  Gout-continue allopurinol    End-stage renal disease.  On hemodialysis.    I have reviewed patient's medical records  received at admission/from hospitalization.  CPT CODE: 16109

## 2013-09-07 NOTE — Progress Notes (Signed)
Patient ID: Stephen Dyer, male   DOB: 11-Feb-1946, 67 y.o.   MRN: [id number]        HISTORY & PHYSICAL  DATE: 08/02/2013     FACILITY: [facility]  LEVEL OF CARE: SNF (31)  ALLERGIES:  Allergies  Allergen Reactions  . Niacin And Related   . Nicotine   . Sulfa Antibiotics   . Zetia [Ezetimibe]    Phenytoin.     Losartan.    Hydrochlorothiazide.    Vancomycin.    Atorvastatin.    Gabapentin.    Sensipar.    Hectoral.    Dilantin.    Icodextrin.   CHIEF COMPLAINT:  Manage anemia of chronic kidney disease, atrial fibrillation, and CAD.    HISTORY OF PRESENT ILLNESS:  The patient is a 67 year-old, African-American male who was hospitalized secondary to a hemoglobin of 7.6.   After hospitalization, patient is readmitted back to this facility for short-term rehabilitation.    ANEMIA: The anemia has been stable. The patient denies fatigue, melena or hematochezia. No complications from the medications currently being used.   Patient was transfused 3 U of packed red blood cells.  Discharge hemoglobin was 9.7.  Patient was guaiac positive.  EGD and colonoscopy done earlier this year were negative.  Protonix was increased to twice a day.    ATRIAL FIBRILLATION: the patients atrial fibrillation remains stable.  The patient denies DOE, tachycardia, orthopnea, transient neurological sx, pedal edema, palpitations, & PNDs.  No complications noted from the medications currently being used.    CAD: The angina has been stable. The patient denies dyspnea on exertion, orthopnea, pedal edema, palpitations and paroxysmal nocturnal dyspnea. No complications noted from the medication presently being used.    PAST MEDICAL HISTORY :  Past Medical History  Diagnosis Date  . Aortic stenosis 04/07/2013  . Atherosclerotic peripheral vascular disease with gangrene 04/07/2013  . Atrial fibrillation 04/21/2013  . Coronary atherosclerosis of native coronary artery 07/19/2013  . Acute  lower gastrointestinal bleeding 07/19/2013  . Acute posthemorrhagic anemia 07/02/2013  . 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 Pure hypercholesterolemia 04/07/2013  . Essential hypertension, benign 03/10/2013  . Gout 03/10/2013  . End stage renal disease on dialysis 03/10/2013  . Prostate cancer 03/10/2013  . PVD (peripheral vascular disease) 03/10/2013  . Personal history of colonic polyps 03/10/2013  . Glaucoma 03/10/2013  . Post traumatic stress disorder (PTSD) 03/10/2013   CAD.     Hyperlipidemia.    PAST SURGICAL HISTORY: Past Surgical History  Procedure Laterality Date  . Aortic valve replacement    . Leg amputation above knee Bilateral     left then right  . Debriderment of aka Left   . Rij      tunneled    Carotid endarterectomy.    SOCIAL HISTORY:  reports that he has quit smoking. He uses smokeless tobacco. HOUSING:  The patient was a resident of this facility.   ALCOHOL:  Denies alcohol use.   ILLICIT DRUGS:  Denies illicit drug use.    FAMILY HISTORY: None  CURRENT MEDICATIONS: Reviewed per [facility]  REVIEW OF SYSTEMS:  See HPI otherwise 14 point ROS is negative.  PHYSICAL EXAMINATION  VS:  T 98.7       P 78      RR 18      BP 110/70      POX%        WT (Lb)  GENERAL: no acute distress, normal body habitus  EYES: conjunctivae normal, sclerae normal, normal eye lids MOUTH/THROAT: lips without lesions,no lesions in the mouth,tongue is without lesions,uvula elevates in midline NECK: supple, trachea midline, no neck masses, no thyroid tenderness, no thyromegaly LYMPHATICS: no LAN in the neck, no supraclavicular LAN RESPIRATORY: breathing is even & unlabored, BS CTAB CARDIAC: heart rate is irregular irregular, no murmur,no extra heart sounds, no edema GI:  ABDOMEN: abdomen soft, normal BS, no masses, no tenderness  LIVER/SPLEEN: no hepatomegaly, no splenomegaly MUSCULOSKELETAL: HEAD: normal to inspection & palpation BACK: no kyphosis, scoliosis or  spinal processes tenderness EXTREMITIES: LEFT UPPER EXTREMITY: full range of motion, normal strength & tone RIGHT UPPER EXTREMITY:  full range of motion, normal strength & tone LEFT LOWER EXTREMITY:  AKA   RIGHT LOWER EXTREMITY: AKA   PSYCHIATRIC: the patient is alert & oriented to person, affect & behavior appropriate  LABS/RADIOLOGY: Chloride 94, BUN 26, creatinine 5.55, otherwise BMP normal.    Hemoglobin 9.7, MCV 84.5, platelets 140, WBC 5.3.    PT 22.9, INR 2.1.    Phosphorus 6.2, albumin 3.3, otherwise liver profile normal.    MRSA by PCR negative.     ASSESSMENT/PLAN:  Anemia of chronic kidney disease.  Status post transfusion.  Reassess hemoglobin level.    Atrial fibrillation.  Rate controlled.    CAD.  Stable.    End-stage renal disease.  Continue hemodialysis.    Mechanical aortic valve replacement.  Continue anticoagulation.    I have reviewed patient's medical records received at admission/from hospitalization.  CPT CODE: 40981

## 2013-10-02 ENCOUNTER — Encounter: Payer: Self-pay | Admitting: *Deleted

## 2013-10-04 ENCOUNTER — Non-Acute Institutional Stay (SKILLED_NURSING_FACILITY): Payer: PRIVATE HEALTH INSURANCE | Admitting: Internal Medicine

## 2013-10-04 DIAGNOSIS — R143 Flatulence: Secondary | ICD-10-CM

## 2013-10-04 DIAGNOSIS — R141 Gas pain: Secondary | ICD-10-CM

## 2013-10-04 DIAGNOSIS — R142 Eructation: Secondary | ICD-10-CM

## 2013-10-04 DIAGNOSIS — M79609 Pain in unspecified limb: Secondary | ICD-10-CM

## 2013-10-04 DIAGNOSIS — N5089 Other specified disorders of the male genital organs: Secondary | ICD-10-CM

## 2013-10-04 DIAGNOSIS — N508 Other specified disorders of male genital organs: Secondary | ICD-10-CM

## 2013-10-05 ENCOUNTER — Other Ambulatory Visit: Payer: Self-pay | Admitting: *Deleted

## 2013-10-05 MED ORDER — OXYCODONE HCL 10 MG PO TABS: ORAL_TABLET | ORAL | Status: DC

## 2013-10-11 ENCOUNTER — Non-Acute Institutional Stay (SKILLED_NURSING_FACILITY): Payer: PRIVATE HEALTH INSURANCE | Admitting: Internal Medicine

## 2013-10-11 DIAGNOSIS — N508 Other specified disorders of male genital organs: Secondary | ICD-10-CM

## 2013-10-11 DIAGNOSIS — N5089 Other specified disorders of the male genital organs: Secondary | ICD-10-CM

## 2013-10-11 DIAGNOSIS — K5900 Constipation, unspecified: Secondary | ICD-10-CM

## 2013-10-25 ENCOUNTER — Non-Acute Institutional Stay (SKILLED_NURSING_FACILITY): Payer: PRIVATE HEALTH INSURANCE | Admitting: Internal Medicine

## 2013-10-25 DIAGNOSIS — R05 Cough: Secondary | ICD-10-CM

## 2013-10-25 DIAGNOSIS — R059 Cough, unspecified: Secondary | ICD-10-CM

## 2013-10-25 DIAGNOSIS — I2510 Atherosclerotic heart disease of native coronary artery without angina pectoris: Secondary | ICD-10-CM

## 2013-10-25 DIAGNOSIS — I4891 Unspecified atrial fibrillation: Secondary | ICD-10-CM

## 2013-10-25 DIAGNOSIS — E7800 Pure hypercholesterolemia, unspecified: Secondary | ICD-10-CM

## 2013-10-30 DIAGNOSIS — R05 Cough: Secondary | ICD-10-CM | POA: Insufficient documentation

## 2013-10-30 DIAGNOSIS — R059 Cough, unspecified: Secondary | ICD-10-CM | POA: Insufficient documentation

## 2013-10-30 NOTE — Progress Notes (Signed)
         PROGRESS NOTE  DATE: 10-25-13  FACILITY: Nursing Home Location: [facility]  LEVEL OF CARE: SNF (31)  Routine Visit  CHIEF COMPLAINT:  Manage cough, atrial fibrillation, CAD and hyperlipidemia  HISTORY OF PRESENT ILLNESS:  REASSESSMENT OF ONGOING PROBLEM(S):  COUGH: New Problem. Patient reports a new onset cough for several days. Cough is non-productive. Precipitating or alleviating factors cannot be identified. There are no other associated signs and symptoms such as shortness of breath, fever, chills or night sweats. There is no respiratory insufficiency. There is no temporal relationship.  ATRIAL FIBRILLATION: the patients atrial fibrillation remains stable.  The patient denies DOE, tachycardia, orthopnea, transient neurological sx, pedal edema, palpitations, & PNDs.  No complications noted from the medications currently being used.  CAD: The angina has been stable. The patient denies dyspnea on exertion, orthopnea, pedal edema, palpitations and paroxysmal nocturnal dyspnea. No complications noted from the medication presently being used.  HYPERLIPIDEMIA: No complications from the medications presently being used. Last fasting lipid panel showed : in 9-14 HDL 25 otherwise fasting lipid panel normal  PAST MEDICAL HISTORY : Reviewed.  No changes.  CURRENT MEDICATIONS: Reviewed per [facility]  REVIEW OF SYSTEMS:  GENERAL: no change in appetite, no fatigue, no weight changes, no fever, chills or weakness RESPIRATORY: no cough, SOB, DOE, wheezing, hemoptysis CARDIAC: no chest pain, edema or palpitations GI: no abdominal pain, diarrhea, constipation, heart burn, nausea or vomiting  PHYSICAL EXAMINATION  VS:  T 98.7       P 72     RR 18      BP 126/66     POX % 99     WT (Lb) 150  GENERAL: no acute distress, normal body habitus EYES: conjunctivae normal, sclerae normal, normal eye lids NECK: supple, trachea midline, no neck masses, no thyroid tenderness, no  thyromegaly LYMPHATICS: no LAN in the neck, no supraclavicular LAN RESPIRATORY: breathing is even & unlabored, BS CTAB CARDIAC: Heart rate is irregularly irregular, no murmur,no extra heart sounds GI: abdomen soft, normal BS, no masses, no tenderness, no hepatomegaly, no splenomegaly PSYCHIATRIC: the patient is alert & oriented to person, affect & behavior appropriate  LABS/RADIOLOGY: 11-14 BUN 55, creatinine 7.16 otherwise CMP normal 10- 14 hemoglobin 9, MCV 90 otherwise CBC normal, BUN 37, creatinine 4.67 otherwise BMP normal  ASSESSMENT/PLAN:  Cough-new problem. Check chest x-ray. Start Mucinex 600 mg twice a day x7 days Atrial fibrillation-rate controlled CAD-stable Hyperlipidemia -- well-controlled Anemia of chronic kidney disease-stable GERD-stable Constipation-well-controlled. MiraLax was increased. End-stage renal disease-on hemodialysis Scrotal mass-urology consult pending  CPT CODE: 81017

## 2013-11-09 DIAGNOSIS — R141 Gas pain: Secondary | ICD-10-CM | POA: Insufficient documentation

## 2013-11-09 DIAGNOSIS — M79609 Pain in unspecified limb: Secondary | ICD-10-CM | POA: Insufficient documentation

## 2013-11-09 DIAGNOSIS — N5089 Other specified disorders of the male genital organs: Secondary | ICD-10-CM | POA: Insufficient documentation

## 2013-11-09 NOTE — Progress Notes (Signed)
Patient ID: Stephen Dyer, male   DOB: [birth year], 68 y.o.   MRN: [id number]          PROGRESS NOTE  DATE: 10/04/2013    FACILITY:  [facility]  LEVEL OF CARE: SNF (31)  Acute Visit  CHIEF COMPLAINT:  Manage lower extremity pain, gas pain, and scrotal mass.      HISTORY OF PRESENT ILLNESS: I was requested by the staff to assess the patient regarding above problem(s):  LOWER EXTREMITY PAIN:  Uncontrolled problem.  Patient is complaining of pain not being well controlled with current p.r.n. Percocet.  His pain is in the bilateral AKA stumps.    GAS PAIN:  New problem.  Patient is complaining of increasing gas and pain related to that.  He would like something to help the gas pain.   He denies nausea, vomiting, diarrhea, or constipation.    LEFT SCROTAL MASS:  New problem.  Patient is complaining of a mass in his left scrotum.  He denies pain.  Symptoms have been present for some time.  There is no temporal relationship.  There are no other associated signs and symptoms.    PAST MEDICAL HISTORY : Reviewed.  No changes.  CURRENT MEDICATIONS: Reviewed per [facility]  REVIEW OF SYSTEMS:  GENERAL: no change in appetite, no fatigue, no weight changes, no fever, chills or weakness RESPIRATORY: no cough, SOB, DOE,, wheezing, hemoptysis CARDIAC: no chest pain, edema or palpitations GI: no abdominal pain, diarrhea, constipation, heart burn, nausea or vomiting  PHYSICAL EXAMINATION  GENERAL: no acute distress, normal body habitus EYES: conjunctivae normal, sclerae normal, normal eye lids NECK: supple, trachea midline, no neck masses, no thyroid tenderness, no thyromegaly LYMPHATICS: no LAN in the neck, no supraclavicular LAN RESPIRATORY: breathing is even & unlabored, BS CTAB CARDIAC: RRR, no murmur,no extra heart sounds, no edema GI: abdomen soft, normal BS, no masses, no tenderness, no hepatomegaly, no splenomegaly PSYCHIATRIC: the patient is alert & oriented to person, affect  & behavior appropriate GENITOURINARY:   SCROTUM: left scrotum has a soft, but firm, mass; there is no tenderness to palpation; there is no erythema or warmth    ASSESSMENT/PLAN:  Bilateral lower extremity pain.  Uncontrolled problem.  Discontinue p.r.n. Percocet.  Start oxycodone 10 mg q.4 p.r.n.    Left scrotal mass.  New problem.  Obtain ultrasound.    Gas pain.  New problem.  Start simethicone 80 mg  t.i.d. p.r.n.      CPT CODE: 16384      Jarica Plass Y Gaila Engebretsen, [place] [phone]

## 2013-11-10 DIAGNOSIS — K5900 Constipation, unspecified: Secondary | ICD-10-CM | POA: Insufficient documentation

## 2013-11-10 NOTE — Progress Notes (Signed)
Patient ID: Alwaleed Obeso, male   DOB: Oct 24, 1945, 68 y.o.   MRN: [id number]          PROGRESS NOTE  DATE: 10/11/2013    FACILITY:  [facility]  LEVEL OF CARE: SNF (31)  Acute Visit  CHIEF COMPLAINT:  Manage left pubic mass and constipation.    HISTORY OF PRESENT ILLNESS: I was requested by the staff to assess the patient regarding above problem(s):  LEFT PUBIC MASS:  Patient had an ultrasound in the left pubic area due to a mass on 10/05/2013.  Ultrasound report showed cystic mass in the left pubic region.  Patient denies any new symptoms.    CONSTIPATION: The constipation is unstable. No complications from the medications presently being used. Patient denies ongoing constipation, abdominal pain, nausea or vomiting.      PAST MEDICAL HISTORY : Reviewed.  No changes.  CURRENT MEDICATIONS: Reviewed per [facility]  REVIEW OF SYSTEMS:  GENERAL: no change in appetite, no fatigue, no weight changes, no fever, chills or weakness RESPIRATORY: no cough, SOB, DOE,, wheezing, hemoptysis CARDIAC: no chest pain, edema or palpitations GI: no abdominal pain, diarrhea, constipation, heart burn, nausea or vomiting  PHYSICAL EXAMINATION  GENERAL: no acute distress, normal body habitus EYES: conjunctivae normal, sclerae normal, normal eye lids NECK: supple, trachea midline, no neck masses, no thyroid tenderness, no thyromegaly LYMPHATICS: no LAN in the neck, no supraclavicular LAN RESPIRATORY: breathing is even & unlabored, BS CTAB CARDIAC: RRR, no murmur,no extra heart sounds, no edema GI: abdomen firm, somewhat distended, diminished BS, no masses, no tenderness, no hepatomegaly, no splenomegaly PSYCHIATRIC: the patient is alert & oriented to person, affect & behavior appropriate  ASSESSMENT/PLAN:  Left pubic mass.  New problem.  Obtain Urology consult.     Constipation.  Uncontrolled problem.  Increase MiraLAX to b.i.d.    CPT CODE: 40347    Celinda Dethlefs Y Eliga Arvie, [place] [phone]

## 2013-11-23 ENCOUNTER — Other Ambulatory Visit: Payer: Self-pay | Admitting: *Deleted

## 2013-11-23 MED ORDER — OXYCODONE HCL 10 MG PO TABS: ORAL_TABLET | ORAL | Status: DC

## 2013-11-23 NOTE — Telephone Encounter (Signed)
[facility] 

## 2013-12-27 ENCOUNTER — Other Ambulatory Visit: Payer: Self-pay | Admitting: *Deleted

## 2013-12-27 MED ORDER — OXYCODONE-ACETAMINOPHEN 5-325 MG PO TABS: ORAL_TABLET | ORAL | Status: DC

## 2013-12-27 NOTE — Telephone Encounter (Signed)
[facility] 

## 2013-12-29 ENCOUNTER — Other Ambulatory Visit: Payer: Self-pay | Admitting: *Deleted

## 2013-12-29 MED ORDER — OXYCODONE HCL 10 MG PO TABS: ORAL_TABLET | ORAL | Status: DC

## 2013-12-29 NOTE — Telephone Encounter (Signed)
[facility] 

## 2013-12-30 ENCOUNTER — Non-Acute Institutional Stay (SKILLED_NURSING_FACILITY): Payer: PRIVATE HEALTH INSURANCE | Admitting: Internal Medicine

## 2013-12-30 DIAGNOSIS — N039 Chronic nephritic syndrome with unspecified morphologic changes: Secondary | ICD-10-CM

## 2013-12-30 DIAGNOSIS — I4891 Unspecified atrial fibrillation: Secondary | ICD-10-CM

## 2013-12-30 DIAGNOSIS — N185 Chronic kidney disease, stage 5: Secondary | ICD-10-CM

## 2013-12-30 DIAGNOSIS — R188 Other ascites: Secondary | ICD-10-CM

## 2013-12-30 DIAGNOSIS — D631 Anemia in chronic kidney disease: Secondary | ICD-10-CM

## 2014-01-04 NOTE — Progress Notes (Signed)
Patient ID: Stephen Dyer, male   DOB: [birth year], 68 y.o.   MRN: [id number]                    HISTORY & PHYSICAL  DATE:  12/30/2013      FACILITY: [facility]    LEVEL OF CARE:   SNF   CHIEF COMPLAINT:  Follow-up of medical issues.    HISTORY OF PRESENT ILLNESS:  [name] has just come back from being hospitalized at the [facility] temporarily for a transfusion.   This is felt to be chronic blood loss and/or anemia of chronic disease.  He was transfused 3 U after finding that his hemoglobin was 6.3 and his blood pressure was 69/45.   Apparently, this was observed at dialysis.  He also appears to have had 9.5 L of ascitic fluid removed from the abdomen.  The analysis of this fluid is not yet available.  He has been having trouble tolerating dialysis since then due to low blood pressure.    PAST MEDICAL HISTORY/PROBLEM LIST:    Chronic renal failure.  On dialysis.    Chronic blood loss anemia and/or anemia of chronic disease.  He is said to have a "slow leak from the gastrointestinal tract" with a work-up in the past that did not identify the source of bleeding.    History of mechanical aortic valve.  On chronic anticoagulation.    Coronary artery disease.    Severe PVD.  Status post bilateral above-knee amputations last year.    History of gout.    History of hyperlipidemia.    Atrial fibrillation.     CURRENT MEDICATIONS:  Medication list is reviewed.     Allopurinol 100 q.d.    Ferrous sulfate 325 b.i.d.    Multivitamin 1 tablet daily.    Lanthanum 500 mg chewable tablets, 1000 mg orally with meals (Fosrenol).    Megace 400 mg/10 mL suspension, 200 mL every morning.    Midodrine 10 mg every Tuesday, Thursday, and Saturday, coinciding with dialysis.    Midodrine 5 mg every Monday, Wednesday, Friday, and Sunday (non-dialysis days).    Ondansetron 4 mg daily.    Oxycodone 10 mg q.4.    Protonix 40 mg b.i.d.    Pravastatin 40 mg daily.    Coumadin.  I am not  exactly sure of the Coumadin dose, although this is followed at dialysis.    REVIEW OF SYSTEMS:   CHEST/RESPIRATORY:  The patient is not complaining of shortness of breath.   CARDIAC:   He is not complaining of chest pain.   GI:  States his abdomen is distended.    PHYSICAL EXAMINATION:   GENERAL APPEARANCE:  The patient does not look to be in any distress.   CHEST/RESPIRATORY:  A few crackles at the right base; otherwise, clear.   CARDIOVASCULAR:  CARDIAC:   Heart sounds are regular.  There are no murmurs.  He has had a left carotid endarterectomy.  His jugular venous pressure is elevated to the angle of the jaw at 90 degrees.   There is scant coccyx edema.   GASTROINTESTINAL:  ABDOMEN:   Distended.  I have no doubt there is ascitic fluid here.  There is no tenderness.   LIVER/SPLEEN/KIDNEYS:  No liver, no spleen.     ASSESSMENT/PLAN:  Chronic renal failure.  On dialysis.  He is currently fluid-overloaded, perhaps because he is not tolerating adequate dialysis.     Anemia of chronic disease and/or chronic blood loss.  Currently status post transfusion of 3 U.    Prosthetic aortic valve.  On Coumadin.  This is followed through dialysis.    Ascitic fluid.   Apparently, he had a large volume paracentesis done a week or so ago.  If the fluid was sent for analysis, I do not have access to this.  He is in a volume-overloaded state.  However, I wonder if this is all right-sided heart failure/fluid overload.    Gastroesophageal reflux disease.    Apparent chronic GI bleeding.  I was really not aware of this.    CPT CODE: 45364

## 2014-02-10 ENCOUNTER — Other Ambulatory Visit: Payer: Self-pay | Admitting: *Deleted

## 2014-02-10 MED ORDER — TRAMADOL HCL 50 MG PO TABS: ORAL_TABLET | ORAL | Status: DC

## 2014-02-10 NOTE — Telephone Encounter (Signed)
[facility] 

## 2014-02-13 ENCOUNTER — Non-Acute Institutional Stay (SKILLED_NURSING_FACILITY): Payer: PRIVATE HEALTH INSURANCE | Admitting: Internal Medicine

## 2014-02-13 DIAGNOSIS — E7800 Pure hypercholesterolemia, unspecified: Secondary | ICD-10-CM

## 2014-02-13 DIAGNOSIS — J189 Pneumonia, unspecified organism: Secondary | ICD-10-CM

## 2014-02-13 DIAGNOSIS — I4891 Unspecified atrial fibrillation: Secondary | ICD-10-CM

## 2014-02-13 DIAGNOSIS — D631 Anemia in chronic kidney disease: Secondary | ICD-10-CM

## 2014-02-13 DIAGNOSIS — N039 Chronic nephritic syndrome with unspecified morphologic changes: Secondary | ICD-10-CM

## 2014-02-13 NOTE — Progress Notes (Signed)
HISTORY & PHYSICAL  DATE: 02/13/2014   FACILITY: [facility]  LEVEL OF CARE: SNF (31)  ALLERGIES:  Allergies  Allergen Reactions  . Atorvastatin   . Gabapentin   . Hectorol [Doxercalciferol]   . Hydrochlorothiazide   . Icodextrin [Dextrin]   . Losartan   . Niacin And Related   . Nicotine   . Phenytoin   . Sensipar [Cinacalcet]   . Sulfa Antibiotics   . Vancomycin   . Zetia [Ezetimibe]     CHIEF COMPLAINT:  Manage pneumonia, anemia of chronic kidney disease atrial fibrillation  HISTORY OF PRESENT ILLNESS: 68 year old African American male was hospitalized secondary to an INR greater than 10 and hemoglobin of 6.7. After hospitalization is readmitted back to the facility for long-term care management.  PNEUMONIA: The pneumonia remains stable.  The patient denies ongoing chest pain, cough, shortness of breath, fever, chills or night sweats. No complications reported from the current antibiotic being used.  ANEMIA: The anemia has been stable. The patient denies fatigue, melena or hematochezia. No complications from the medications currently being used. At presentation to the ER hemoglobin was 6.7. he received packed red blood cells transfusion. At discharge hemoglobin was 8.9.  ATRIAL FIBRILLATION: the patients atrial fibrillation remains stable.  The patient denies DOE, tachycardia, orthopnea, transient neurological sx, pedal edema, palpitations, & PNDs.  No complications noted from the medications currently being used.  PAST MEDICAL HISTORY :  Past Medical History  Diagnosis Date  . Aortic stenosis 04/07/2013  . Atherosclerotic peripheral vascular disease with gangrene 04/07/2013  . Atrial fibrillation 04/21/2013  . Coronary atherosclerosis of native coronary artery 07/19/2013  . Acute lower gastrointestinal bleeding 07/19/2013  . Acute posthemorrhagic anemia 07/02/2013  . 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 Pure  hypercholesterolemia 04/07/2013  . Essential hypertension, benign 03/10/2013  . Gout 03/10/2013  . End stage renal disease on dialysis 03/10/2013  . Prostate cancer 03/10/2013  . PVD (peripheral vascular disease) 03/10/2013  . Personal history of colonic polyps 03/10/2013  . Glaucoma 03/10/2013  . Post traumatic stress disorder (PTSD) 03/10/2013    PAST SURGICAL HISTORY: Past Surgical History  Procedure Laterality Date  . Aortic valve replacement    . Leg amputation above knee Bilateral     left then right  . Debriderment of aka Left   . Rij      tunneled     SOCIAL HISTORY:  reports that he has quit smoking. He uses smokeless tobacco.  denies alcohol or illicit drug use.  FAMILY HISTORY: Hypertension  CURRENT MEDICATIONS: Reviewed per MAR/see medication list  REVIEW OF SYSTEMS:  See HPI otherwise 14 point ROS is negative.  PHYSICAL EXAMINATION  VS:  See VS section  GENERAL: no acute distress, normal body habitus EYES: conjunctivae normal, sclerae normal, normal eye lids MOUTH/THROAT: lips without lesions,no lesions in the mouth,tongue is without lesions,uvula elevates in midline NECK: supple, trachea midline, no neck masses, no thyroid tenderness, no thyromegaly LYMPHATICS: no LAN in the neck, no supraclavicular LAN RESPIRATORY: breathing is even & unlabored, BS CTAB CARDIAC: Heart rate is irregularly irregular, no murmur,no extra heart sounds, no edema GI:  ABDOMEN: abdomen soft, normal BS, no masses, no tenderness  LIVER/SPLEEN: no hepatomegaly, no splenomegaly MUSCULOSKELETAL: HEAD: normal to inspection  EXTREMITIES: Status post bilateral AKA PSYCHIATRIC: the patient is alert & oriented to person, affect & behavior appropriate  LABS/RADIOLOGY: None  Chest CT-pneumonia and pulmonary nodules  ASSESSMENT/PLAN:  Pneumonia-continue  antibiotics Anemia of chronic kidney disease-status post transfusion. Recheck hemoglobin level. Atrial fibrillation-rate  controlled Hyperlipidemia-check fasting lipid panel CAD-stable End-stage renal disease-on hemodialysis Check CBC and CMP Ascites-status post paracenteses. Pulmonary nodules-followup in 5-6 months  I have reviewed patient's medical records received at admission/from hospitalization.  CPT CODE: 09983  Stephen Dyer Y Dontez Hauss, [place] [phone]

## 2014-02-15 ENCOUNTER — Non-Acute Institutional Stay (SKILLED_NURSING_FACILITY): Payer: PRIVATE HEALTH INSURANCE | Admitting: Internal Medicine

## 2014-02-15 DIAGNOSIS — L0291 Cutaneous abscess, unspecified: Secondary | ICD-10-CM

## 2014-02-15 DIAGNOSIS — L0390 Cellulitis, unspecified: Secondary | ICD-10-CM

## 2014-02-17 NOTE — Progress Notes (Signed)
Patient ID: Stephen Dyer, male   DOB: [birth year], 68 y.o.   MRN: [id number]            PROGRESS NOTE  DATE: 02/15/2014        FACILITY:  [facility]  LEVEL OF CARE: SNF (31)  Acute Visit  CHIEF COMPLAINT:  Manage left middle finger swelling and pain.    HISTORY OF PRESENT ILLNESS: I was requested by the staff to assess the patient regarding above problem(s):    The patient is complaining of pain and swelling in left middle finger, noted since yesterday.  He denies any injury.   He denies alleviating or exacerbating factors.  Symptoms are constantly present.  There is no temporal relationship.    PAST MEDICAL HISTORY : Reviewed.  No changes/see problem list  CURRENT MEDICATIONS: Reviewed per MAR/see medication list  REVIEW OF SYSTEMS:  GENERAL: no change in appetite, no fatigue, no weight changes, no fever, chills or weakness RESPIRATORY: no cough, SOB, DOE,, wheezing, hemoptysis CARDIAC: no chest pain, edema or palpitations GI: no abdominal pain, diarrhea, constipation, heart burn, nausea or vomiting  PHYSICAL EXAMINATION  VS:  T 97.1       P 68      RR 18      BP 114/60     POX 95%       WT (Lb) 154      GENERAL: no acute distress, normal body habitus NECK: supple, trachea midline, no neck masses, no thyroid tenderness, no thyromegaly RESPIRATORY: breathing is even & unlabored, BS CTAB CARDIAC: RRR, no murmur,no extra heart sounds, no edema GI: abdomen soft, normal BS, no masses, no tenderness, no hepatomegaly, no splenomegaly PSYCHIATRIC: the patient is alert & oriented to person, affect & behavior appropriate SKIN:  INSPECTION:  Left middle finger is edematous and exquisitely tender to palpation.    ASSESSMENT/PLAN:  Left middle finger cellulitis.  New problem.  Start cephalexin 500 mg q.d. for 10 days and probiotics b.i.d. for 14 days.    CPT CODE: 64332       Mansfield Dann Y Buffi Ewton, [place] [phone]

## 2014-02-18 DIAGNOSIS — L0390 Cellulitis, unspecified: Secondary | ICD-10-CM | POA: Insufficient documentation

## 2014-02-20 ENCOUNTER — Non-Acute Institutional Stay (SKILLED_NURSING_FACILITY): Payer: PRIVATE HEALTH INSURANCE | Admitting: Internal Medicine

## 2014-02-20 DIAGNOSIS — D631 Anemia in chronic kidney disease: Secondary | ICD-10-CM

## 2014-02-20 DIAGNOSIS — N039 Chronic nephritic syndrome with unspecified morphologic changes: Principal | ICD-10-CM

## 2014-02-22 ENCOUNTER — Emergency Department (HOSPITAL_COMMUNITY): Payer: MEDICARE

## 2014-02-22 ENCOUNTER — Encounter (HOSPITAL_COMMUNITY): Payer: Self-pay | Admitting: Emergency Medicine

## 2014-02-22 ENCOUNTER — Inpatient Hospital Stay (HOSPITAL_COMMUNITY)
Admission: EM | Admit: 2014-02-22 | Discharge: 2014-02-24 | DRG: 871 | Disposition: A | Discharge status: Hospice | Payer: MEDICARE | Attending: Internal Medicine | Admitting: Internal Medicine

## 2014-02-22 ENCOUNTER — Non-Acute Institutional Stay (SKILLED_NURSING_FACILITY): Payer: PRIVATE HEALTH INSURANCE | Admitting: Internal Medicine

## 2014-02-22 DIAGNOSIS — Z860100 Personal history of colon polyps, unspecified: Secondary | ICD-10-CM

## 2014-02-22 DIAGNOSIS — Z841 Family history of disorders of kidney and ureter: Secondary | ICD-10-CM

## 2014-02-22 DIAGNOSIS — N186 End stage renal disease: Secondary | ICD-10-CM | POA: Diagnosis present

## 2014-02-22 DIAGNOSIS — Z954 Presence of other heart-valve replacement: Secondary | ICD-10-CM

## 2014-02-22 DIAGNOSIS — Z992 Dependence on renal dialysis: Secondary | ICD-10-CM

## 2014-02-22 DIAGNOSIS — E162 Hypoglycemia, unspecified: Secondary | ICD-10-CM | POA: Diagnosis present

## 2014-02-22 DIAGNOSIS — I509 Heart failure, unspecified: Secondary | ICD-10-CM | POA: Diagnosis present

## 2014-02-22 DIAGNOSIS — M109 Gout, unspecified: Secondary | ICD-10-CM | POA: Diagnosis present

## 2014-02-22 DIAGNOSIS — Z87891 Personal history of nicotine dependence: Secondary | ICD-10-CM

## 2014-02-22 DIAGNOSIS — J189 Pneumonia, unspecified organism: Secondary | ICD-10-CM | POA: Diagnosis present

## 2014-02-22 DIAGNOSIS — I10 Essential (primary) hypertension: Secondary | ICD-10-CM | POA: Diagnosis present

## 2014-02-22 DIAGNOSIS — F4310 Post-traumatic stress disorder, unspecified: Secondary | ICD-10-CM | POA: Diagnosis present

## 2014-02-22 DIAGNOSIS — R6521 Severe sepsis with septic shock: Secondary | ICD-10-CM

## 2014-02-22 DIAGNOSIS — E7800 Pure hypercholesterolemia, unspecified: Secondary | ICD-10-CM | POA: Diagnosis present

## 2014-02-22 DIAGNOSIS — I350 Nonrheumatic aortic (valve) stenosis: Secondary | ICD-10-CM | POA: Diagnosis present

## 2014-02-22 DIAGNOSIS — Z66 Do not resuscitate: Secondary | ICD-10-CM | POA: Diagnosis present

## 2014-02-22 DIAGNOSIS — R4182 Altered mental status, unspecified: Secondary | ICD-10-CM

## 2014-02-22 DIAGNOSIS — Z79899 Other long term (current) drug therapy: Secondary | ICD-10-CM

## 2014-02-22 DIAGNOSIS — I959 Hypotension, unspecified: Secondary | ICD-10-CM

## 2014-02-22 DIAGNOSIS — I120 Hypertensive chronic kidney disease with stage 5 chronic kidney disease or end stage renal disease: Secondary | ICD-10-CM | POA: Diagnosis present

## 2014-02-22 DIAGNOSIS — R6520 Severe sepsis without septic shock: Secondary | ICD-10-CM

## 2014-02-22 DIAGNOSIS — E785 Hyperlipidemia, unspecified: Secondary | ICD-10-CM | POA: Diagnosis present

## 2014-02-22 DIAGNOSIS — I4891 Unspecified atrial fibrillation: Secondary | ICD-10-CM | POA: Diagnosis present

## 2014-02-22 DIAGNOSIS — N039 Chronic nephritic syndrome with unspecified morphologic changes: Secondary | ICD-10-CM

## 2014-02-22 DIAGNOSIS — T45515A Adverse effect of anticoagulants, initial encounter: Secondary | ICD-10-CM | POA: Diagnosis present

## 2014-02-22 DIAGNOSIS — Z8249 Family history of ischemic heart disease and other diseases of the circulatory system: Secondary | ICD-10-CM

## 2014-02-22 DIAGNOSIS — R791 Abnormal coagulation profile: Secondary | ICD-10-CM | POA: Diagnosis present

## 2014-02-22 DIAGNOSIS — Z8546 Personal history of malignant neoplasm of prostate: Secondary | ICD-10-CM

## 2014-02-22 DIAGNOSIS — E872 Acidosis, unspecified: Secondary | ICD-10-CM | POA: Diagnosis present

## 2014-02-22 DIAGNOSIS — D631 Anemia in chronic kidney disease: Secondary | ICD-10-CM | POA: Diagnosis present

## 2014-02-22 DIAGNOSIS — H409 Unspecified glaucoma: Secondary | ICD-10-CM | POA: Diagnosis present

## 2014-02-22 DIAGNOSIS — Z515 Encounter for palliative care: Secondary | ICD-10-CM

## 2014-02-22 DIAGNOSIS — J90 Pleural effusion, not elsewhere classified: Secondary | ICD-10-CM

## 2014-02-22 DIAGNOSIS — S78119A Complete traumatic amputation at level between unspecified hip and knee, initial encounter: Secondary | ICD-10-CM

## 2014-02-22 DIAGNOSIS — R188 Other ascites: Secondary | ICD-10-CM | POA: Diagnosis present

## 2014-02-22 DIAGNOSIS — K5900 Constipation, unspecified: Secondary | ICD-10-CM | POA: Diagnosis present

## 2014-02-22 DIAGNOSIS — Z7901 Long term (current) use of anticoagulants: Secondary | ICD-10-CM

## 2014-02-22 DIAGNOSIS — I739 Peripheral vascular disease, unspecified: Secondary | ICD-10-CM | POA: Diagnosis present

## 2014-02-22 DIAGNOSIS — A419 Sepsis, unspecified organism: Principal | ICD-10-CM | POA: Diagnosis present

## 2014-02-22 DIAGNOSIS — I359 Nonrheumatic aortic valve disorder, unspecified: Secondary | ICD-10-CM | POA: Diagnosis present

## 2014-02-22 DIAGNOSIS — I2510 Atherosclerotic heart disease of native coronary artery without angina pectoris: Secondary | ICD-10-CM | POA: Diagnosis present

## 2014-02-22 LAB — COMPREHENSIVE METABOLIC PANEL
ALT: 161 U/L — ABNORMAL HIGH (ref 0–53)
AST: 343 U/L — ABNORMAL HIGH (ref 0–37)
Albumin: 4.1 g/dL (ref 3.5–5.2)
Alkaline Phosphatase: 124 U/L — ABNORMAL HIGH (ref 39–117)
BUN: 46 mg/dL — AB (ref 6–23)
CO2: 17 mEq/L — ABNORMAL LOW (ref 19–32)
Calcium: 10.7 mg/dL — ABNORMAL HIGH (ref 8.4–10.5)
Chloride: 92 mEq/L — ABNORMAL LOW (ref 96–112)
Creatinine, Ser: 4.83 mg/dL — ABNORMAL HIGH (ref 0.50–1.35)
GFR calc Af Amer: 13 mL/min — ABNORMAL LOW (ref >90)
GFR calc non Af Amer: 11 mL/min — ABNORMAL LOW (ref >90)
Glucose, Bld: 22 mg/dL — CL (ref 70–99)
Potassium: 4.6 mEq/L (ref 3.7–5.3)
Sodium: 143 mEq/L (ref 137–147)
TOTAL PROTEIN: 8 g/dL (ref 6.0–8.3)
Total Bilirubin: 1.5 mg/dL — ABNORMAL HIGH (ref 0.3–1.2)

## 2014-02-22 LAB — PRO B NATRIURETIC PEPTIDE: Pro B Natriuretic peptide (BNP): >70000 pg/mL — ABNORMAL HIGH (ref 0–125)

## 2014-02-22 LAB — CBC WITH DIFFERENTIAL/PLATELET
BASOS ABS: 0 10*3/uL (ref 0.0–0.1)
Basophils Relative: 0 % (ref 0–1)
EOS ABS: 0 10*3/uL (ref 0.0–0.7)
Eosinophils Relative: 0 % (ref 0–5)
HCT: 34.3 % — ABNORMAL LOW (ref 39.0–52.0)
Hemoglobin: 10.4 g/dL — ABNORMAL LOW (ref 13.0–17.0)
LYMPHS PCT: 9 % — AB (ref 12–46)
Lymphs Abs: 0.5 10*3/uL — ABNORMAL LOW (ref 0.7–4.0)
MCH: 29.5 pg (ref 26.0–34.0)
MCHC: 30.3 g/dL (ref 30.0–36.0)
MCV: 97.4 fL (ref 78.0–100.0)
Monocytes Absolute: 0.8 10*3/uL (ref 0.1–1.0)
Monocytes Relative: 14 % — ABNORMAL HIGH (ref 3–12)
NEUTROS ABS: 4.6 10*3/uL (ref 1.7–7.7)
Neutrophils Relative %: 77 % (ref 43–77)
PLATELETS: 186 10*3/uL (ref 150–400)
RBC: 3.52 MIL/uL — ABNORMAL LOW (ref 4.22–5.81)
RDW: 24.8 % — AB (ref 11.5–15.5)
WBC: 5.9 10*3/uL (ref 4.0–10.5)

## 2014-02-22 LAB — I-STAT CG4 LACTIC ACID, ED: Lactic Acid, Venous: 10.06 mmol/L — ABNORMAL HIGH (ref 0.5–2.2)

## 2014-02-22 LAB — CBG MONITORING, ED
Glucose-Capillary: 12 mg/dL — CL (ref 70–99)
Glucose-Capillary: 85 mg/dL (ref 70–99)
Glucose-Capillary: 108 mg/dL — ABNORMAL HIGH (ref 70–99)

## 2014-02-22 LAB — PROTIME-INR
INR: 5.58 — AB (ref 0.00–1.49)
PROTHROMBIN TIME: 47.7 s — AB (ref 11.6–15.2)

## 2014-02-22 LAB — I-STAT TROPONIN, ED: Troponin i, poc: 0.74 ng/mL (ref 0.00–0.08)

## 2014-02-22 LAB — AMMONIA: Ammonia: 54 umol/L (ref 11–60)

## 2014-02-22 MED ORDER — DIAZEPAM 5 MG/ML IJ SOLN: 2.5000 mg | INTRAMUSCULAR | Status: DC | PRN

## 2014-02-22 MED ORDER — SODIUM CHLORIDE 0.9 % IV SOLN
INTRAVENOUS | Status: DC
  Administered 2014-02-22: 12:00:00 via INTRAVENOUS

## 2014-02-22 MED ORDER — PIPERACILLIN-TAZOBACTAM 3.375 G IVPB
3.3750 g | Freq: Once | INTRAVENOUS | Status: AC
  Administered 2014-02-22: 3.375 g via INTRAVENOUS
  Filled 2014-02-22: qty 50

## 2014-02-22 MED ORDER — PANTOPRAZOLE SODIUM 40 MG PO TBEC
40.0000 mg | DELAYED_RELEASE_TABLET | Freq: Every day | ORAL | Status: DC
  Administered 2014-02-22 – 2014-02-24 (×3): 40 mg via ORAL
  Filled 2014-02-22 (×4): qty 1

## 2014-02-22 MED ORDER — HYDROMORPHONE HCL PF 1 MG/ML IJ SOLN
0.5000 mg | INTRAMUSCULAR | Status: DC | PRN
  Filled 2014-02-22: qty 1

## 2014-02-22 MED ORDER — BIOTENE DRY MOUTH MT LIQD
15.0000 mL | Freq: Two times a day (BID) | OROMUCOSAL | Status: DC
  Administered 2014-02-22 – 2014-02-24 (×4): 15 mL via OROMUCOSAL

## 2014-02-22 MED ORDER — SODIUM CHLORIDE 0.9 % IV SOLN
INTRAVENOUS | Status: AC
  Administered 2014-02-22: 15:00:00 via INTRAVENOUS

## 2014-02-22 MED ORDER — POLYETHYLENE GLYCOL 3350 17 G PO PACK
17.0000 g | PACK | Freq: Every day | ORAL | Status: DC | PRN
  Filled 2014-02-22: qty 1

## 2014-02-22 MED ORDER — DOCUSATE SODIUM 100 MG PO CAPS
100.0000 mg | ORAL_CAPSULE | Freq: Two times a day (BID) | ORAL | Status: DC
  Administered 2014-02-22 – 2014-02-24 (×4): 100 mg via ORAL
  Filled 2014-02-22 (×5): qty 1

## 2014-02-22 MED ORDER — VANCOMYCIN HCL IN DEXTROSE 1-5 GM/200ML-% IV SOLN
1000.0000 mg | Freq: Once | INTRAVENOUS | Status: AC
Start: 2014-02-22 — End: 2014-02-22
  Administered 2014-02-22: 1000 mg via INTRAVENOUS
  Filled 2014-02-22: qty 200

## 2014-02-22 MED ORDER — DEXTROSE 50 % IV SOLN
1.0000 | Freq: Once | INTRAVENOUS | Status: AC
  Administered 2014-02-22: 50 mL via INTRAVENOUS
  Filled 2014-02-22: qty 50

## 2014-02-22 MED ORDER — ACETAMINOPHEN 325 MG PO TABS: 325.0000 mg | ORAL_TABLET | Freq: Four times a day (QID) | ORAL | Status: DC | PRN

## 2014-02-22 MED ORDER — ONDANSETRON HCL 4 MG PO TABS: 4.0000 mg | ORAL_TABLET | Freq: Four times a day (QID) | ORAL | Status: DC | PRN

## 2014-02-22 MED ORDER — BISACODYL 5 MG PO TBEC: 5.0000 mg | DELAYED_RELEASE_TABLET | Freq: Every day | ORAL | Status: DC | PRN

## 2014-02-22 MED ORDER — SODIUM CHLORIDE 0.9 % IV BOLUS (SEPSIS): 1000.0000 mL | Freq: Once | INTRAVENOUS | Status: DC

## 2014-02-22 MED ORDER — ONDANSETRON HCL 4 MG/2ML IJ SOLN
4.0000 mg | Freq: Four times a day (QID) | INTRAMUSCULAR | Status: DC | PRN
  Administered 2014-02-23: 4 mg via INTRAVENOUS
  Filled 2014-02-22: qty 2

## 2014-02-22 NOTE — ED Provider Notes (Signed)
  This was a shared visit with a mid-level provided (NP or PA).  Throughout the patient's course I was available for consultation/collaboration.  I saw the ECG (if appropriate), relevant labs and studies - I agree with the interpretation.  On my exam the patient was clearly ill. The patient was hypoxic, hypotensive, tachycardic. Patient was waxing and waning in terms of interactivity. With his significant medical problems, patient broad workup conducted, broad differential considered. Patient's initial evaluation demonstrated lactic acidosis, supratherapeutic INR. I reviewed the patient's x-ray which demonstrated right-sided pleural effusion, new. Given the patient's pleural effusion, his hypoxia, hypertension, thoracentesis would have been an option if the patient was not supratherapeutic, and otherwise very ill. After return of labs, patient had evaluation concurrently with critical care. Patient was made DO NOT RESUSCITATE after discussion with his family, but will continue to receive current interventions. Given the patient's critical illness he was admitted to the step down unit.   CRITICAL CARE Performed by: Carmin Muskrat Total critical care time: 45 Critical care time was exclusive of separately billable procedures and treating other patients. Critical care was necessary to treat or prevent imminent or life-threatening deterioration. Critical care was time spent personally by me on the following activities: development of treatment plan with patient and/or surrogate as well as nursing, discussions with consultants, evaluation of patient's response to treatment, examination of patient, obtaining history from patient or surrogate, ordering and performing treatments and interventions, ordering and review of laboratory studies, ordering and review of radiographic studies, pulse oximetry and re-evaluation of patient's condition.       Carmin Muskrat, MD 02/22/14 1558

## 2014-02-22 NOTE — ED Notes (Signed)
RN tranported pt to CT on monitor v/s monitored while in CT. Pt remained stable.

## 2014-02-22 NOTE — Progress Notes (Signed)
Patient's blood pressure 64/18 on admission.  Dr. Sheran Fava notified per order.  New order received to discontinue order with parameters; will continue to monitor.

## 2014-02-22 NOTE — ED Notes (Addendum)
To ED via [facility] from [facility] with c/o decreased LOC-- found to be hypotensive-- on arrival -- pt awake, responsive, oriented x 4, w/d-- c/o left arm being cold. Answers questions appropriately. IV --18 g in right upper arm per EMS, Dialysis T, TH, S-- fistula with bruit in left upper arm. Dry wt.150#

## 2014-02-22 NOTE — Progress Notes (Signed)
Patient received from ED.  Patient and family oriented to room and unit.  Vitals obtained; denies pain.  Will continue to monitor.

## 2014-02-22 NOTE — H&P (Addendum)
[facility] History and Physical  Stephen Dyer RKY:[id number] DOB: Oct 07, 1945 DOA: 02/22/2014  Referring physician:  Carmin Muskrat PCP:  Elenora Fender, NP   Chief Complaint:  LOC  HPI:  The patient is a 68 y.o. year-old male with history of CAD, CHF, hypertension, hyperlipidemia, atrial fibrillation on Coumadin anticoagulation, peripheral vascular disease status post above-the-knee amputations bilaterally, gout, end-stage renal disease on hemodialysis on Tuesday, Thursday, Saturday, glaucoma, PTSD who presents with loss of consciousness.  The patient has been in a poor state of health the last 2 years according to his wife who is at bedside. He is currently living at [facility] and undergoing hemodialysis on Tuesday, Thursday, Saturday schedule. He has had multiple recent hospitalizations, most recently about a week ago at that [facility] [place] where he was in the intensive care unit for approximately 15 days. He has not been able to tolerate hemodialysis secondary to low blood pressures. Because of his inability to tolerate hemodialysis, he has had recurrent accumulation of ascites and pleural effusions. He was discharged a week ago on antibiotics, and after those are completed, he was started on Keflex a few days ago for a possible finger infection.  Today he was found to have decreased level of consciousness at [facility] and his blood pressure was as low as the 40s/20s. He was awake and responsive and oriented x4. He was complaining of his left arm being cold. According to his wife, he has had increased cough, but no known fevers. No recent nausea, vomiting, diarrhea. He is anuric.    In the emergency department, temperature 97.7, pulse 35 then up to the 110s, respirations 10-29, blood pressure ranging from 40/20 with map in the 20s to the 60s/30s.  Oxygen saturation in the mid 70s on room air. He was started on nasal cannula and oxygen saturations have been 100% since then. Labs were notable  for stable CBC, BMP consistent with end-stage renal disease, calcium 10.7, INR 5.58. His glucose was 22. He was given D50 and his glucose recovered.  His chest x-ray demonstrated progressive congestive heart failure with progressive right pleural effusion and low lung volumes.  His CT chest demonstrated large right and very small left pleural effusion, cardiomegaly without evidence of pulmonary edema, extensive atherosclerosis, and a stable 0.6 cm left upper lobe pulmonary nodule. His lactic acid was 10. He was seen by pulmonary critical care, however after discussion with the patient's wife, he it was clear that the patient would not want aggressive resuscitation. He is DO NOT RESUSCITATE. They requested that he be admitted to the hospitalist service for further treatment. I also spoke at length with the wife  and offered the possibility of admission to step down for IV fluids, antibiotics, and other noninvasive treatments versus full palliation with admission to medical surgical room for comfort measures only. She opted for the latter.    Review of Systems:  Limited by decreased LOC  Past Medical History  Diagnosis Date  . Aortic stenosis 04/07/2013  . Atherosclerotic peripheral vascular disease with gangrene 04/07/2013  . Atrial fibrillation 04/21/2013  . Coronary atherosclerosis of native coronary artery 07/19/2013  . Acute lower gastrointestinal bleeding 07/19/2013  . Acute posthemorrhagic anemia 07/02/2013  . 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 Pure hypercholesterolemia 04/07/2013  . Essential hypertension, benign 03/10/2013  . Gout 03/10/2013  . End stage renal disease on dialysis 03/10/2013  . Prostate cancer 03/10/2013  . PVD (peripheral vascular disease) 03/10/2013  . Personal history of  colonic polyps 03/10/2013  . Glaucoma 03/10/2013  . Post traumatic stress disorder (PTSD) 03/10/2013   Past Surgical History  Procedure Laterality Date  . Aortic valve replacement    . Leg  amputation above knee Bilateral     left then right  . Debriderment of aka Left   . Rij      tunneled    Social History:  reports that he has quit smoking. He uses smokeless tobacco. He reports that he does not drink alcohol or use illicit drugs. [place]  Allergies  Allergen Reactions  . Atorvastatin     Unknown   . Gabapentin     Unknown   . Hectorol [Doxercalciferol]     Unknown   . Hydrochlorothiazide     Unknown   . Icodextrin [Dextrin]     Unknown   . Losartan     Unknown   . Niacin And Related     Unknown   . Nicotine     Unknown   . Phenytoin     Unknown   . Sensipar [Cinacalcet]     Unknown   . Sulfa Antibiotics     Unknown   . Vancomycin     Unknown   . Zetia [Ezetimibe]     Unknown     Family History  Problem Relation Age of Onset  . CAD    . Kidney disease       Prior to Admission medications   Medication Sig Start Date End Date Taking? Authorizing Provider  acetaminophen (TYLENOL) 325 MG tablet Take 325 mg by mouth every 6 (six) hours as needed (for pain).   Yes Historical Provider, MD  bisacodyl (DULCOLAX) 5 MG EC tablet Take 5 mg by mouth daily as needed for moderate constipation.   Yes Historical Provider, MD  cephALEXin (KEFLEX) 500 MG capsule Take 500 mg by mouth 4 (four) times daily. On dialysis days give after dialysis for 10 days   Yes Historical Provider, MD  docusate sodium (COLACE) 100 MG capsule Take 100 mg by mouth 2 (two) times daily.   Yes Historical Provider, MD  Folic Acid-Vit T2-WPY K99 (FOLBEE) 2.5-25-1 MG TABS tablet Take 1 tablet by mouth daily.   Yes Historical Provider, MD  lanthanum (FOSRENOL) 500 MG chewable tablet Chew 2,000 mg by mouth 3 (three) times daily with meals.   Yes Historical Provider, MD  megestrol (MEGACE) 40 MG/ML suspension Take 200 mg by mouth daily.   Yes Historical Provider, MD  midodrine (PROAMATINE) 10 MG tablet Take 10 mg by mouth 3 (three) times daily.    Yes Historical Provider, MD  ondansetron  (ZOFRAN) 4 MG tablet Take 4 mg by mouth every 8 (eight) hours as needed for nausea or vomiting.   Yes Historical Provider, MD  pantoprazole (PROTONIX) 40 MG tablet Take 40 mg by mouth daily.   Yes Historical Provider, MD  pravastatin (PRAVACHOL) 40 MG tablet Take 40 mg by mouth at bedtime. For cholesterol   Yes Historical Provider, MD  traMADol (ULTRAM) 50 MG tablet Take one tablet by mouth every 6 hours as needed for pain 02/10/14  Yes Monina C Medina-Vargas, NP  warfarin (COUMADIN) 4 MG tablet Take 4 mg by mouth daily.   Yes Historical Provider, MD   Physical Exam: Filed Vitals:   02/22/14 1426 02/22/14 1445 02/22/14 1451 02/22/14 1506  BP:  61/49  71/50  Pulse:      Temp:      TempSrc:  Resp:  18  22  SpO2: 100%  100%      General:  AAM, NAD except for mild tachypnea, nasal canula in place, sleeping but arousable and intelligible despite low blood pressures  Eyes:  PERRL, anicteric, mildly injected bilaterally  ENT:  Nares clear.  OP clear, non-erythematous without plaques or exudates.  MMM.  Neck:  Supple without TM or JVD.    Lymph:  No cervical, supraclavicular, or submandibular LAD.  Cardiovascular:  RRR, normal S1, S2, + gallop vs. 2/6 systolic murmur at apex.  Cool extremities  Respiratory:  Diminished bilateral breath sounds with rales heard anteriorly at the apex bilaterally, no wheezes or rhonchi, no increased WOB.  Abdomen:  Hypoactive BS.  Soft, mildly distended, TTP diffusely without rebound or guarding, + hepatomegaly    Skin:  No rashes or focal lesions.  Musculoskeletal:  Normal bulk and tone upper extremities.  1+ bilateral LE edema of his AKA stumps  Psychiatric:  Asleep but arouseable, O x 4. Falls asleep and snores rapidly  Neurologic:  Grossly intact.  Labs on Admission:  Basic Metabolic Panel:  Recent Labs Lab 02/22/14 1122  NA 143  K 4.6  CL 92*  CO2 17*  GLUCOSE 22*  BUN 46*  CREATININE 4.83*  CALCIUM 10.7*   Liver Function  Tests:  Recent Labs Lab 02/22/14 1122  AST 343*  ALT 161*  ALKPHOS 124*  BILITOT 1.5*  PROT 8.0  ALBUMIN 4.1   No results found for this basename: LIPASE, AMYLASE,  in the last 168 hours  Recent Labs Lab 02/22/14 1122  AMMONIA 54   CBC:  Recent Labs Lab 02/22/14 1122  WBC 5.9  NEUTROABS 4.6  HGB 10.4*  HCT 34.3*  MCV 97.4  PLT 186   Cardiac Enzymes: No results found for this basename: CKTOTAL, CKMB, CKMBINDEX, TROPONINI,  in the last 168 hours  BNP (last 3 results)  Recent Labs  02/22/14 1122  PROBNP >70000.0*   CBG:  Recent Labs Lab 02/22/14 1226 02/22/14 1255 02/22/14 1355  GLUCAP 12* 85 108*    Radiological Exams on Admission: Ct Head Wo Contrast  02/22/2014   CLINICAL DATA:  Hypotensive, altered mental status  EXAM: CT HEAD WITHOUT CONTRAST  TECHNIQUE: Contiguous axial images were obtained from the base of the skull through the vertex without intravenous contrast.  COMPARISON:  None.  FINDINGS: No evidence of parenchymal hemorrhage or extra-axial fluid collection. No mass lesion, mass effect, or midline shift.  No CT evidence of acute infarction.  Subcortical white matter and periventricular small vessel ischemic changes. Intracranial atherosclerosis.  Global cortical atrophy.  No ventriculomegaly.  The visualized paranasal sinuses are essentially clear. The mastoid air cells are unopacified.  No evidence of calvarial fracture.  IMPRESSION: No evidence of acute intracranial abnormality.  Atrophy with small vessel ischemic changes and intracranial atherosclerosis.   Electronically Signed   By: Charline Bills M.D.   On: 02/22/2014 12:53   Ct Chest Wo Contrast  02/22/2014   CLINICAL DATA:  Hypoxia and tachycardia.  EXAM: CT CHEST WITHOUT CONTRAST  TECHNIQUE: Multidetector CT imaging of the chest was performed following the standard protocol without IV contrast.  COMPARISON:  CT chest 02/01/2014 and 09/23/2012. Plain film of the chest earlier the same day and  01/29/2014.  FINDINGS: A large right pleural effusion is identified. Small right pleural effusion is also seen. There is no pericardial effusion. Heart size is upper normal with atrial enlargement. There is extensive aortic and coronary atherosclerotic vascular  disease. No axillary, hilar or mediastinal lymphadenopathy is identified. 0.6 cm left upper lobe nodule is unchanged on image 19, unchanged since the 2014 exam. Compressive atelectasis on the right the lungs are otherwise clear. Instantly imaged upper abdomen demonstrates no focal abnormality.  IMPRESSION: Large right and very small left pleural effusion.  Cardiomegaly without evidence of pulmonary edema  Extensive atherosclerosis.  Unchanged 0.6 cm left upper lobe pulmonary nodule.   Electronically Signed   By: Inge Rise M.D.   On: 02/22/2014 13:00   Dg Chest Port 1 View  02/22/2014   CLINICAL DATA:  Hypotensive and tachycardic.  Shortness breath.  EXAM: PORTABLE CHEST - 1 VIEW  COMPARISON:  CT chest 02/01/2014  FINDINGS: The heart is enlarged. The patient is status post median sternotomy for aortic valve replacement. Right pleural effusion has increased. Mild edema is worse on the right. The lung volumes are low. Minimal atelectasis is present at the left base. The visualized soft tissues and bony thorax are otherwise unremarkable.  IMPRESSION: 1. Scratch the persistent and likely progressive congestive heart failure. 2. Progressive right pleural effusion and asymmetric right-sided edema. 3. Low lung volumes.   Electronically Signed   By: Lawrence Santiago M.D.   On: 02/22/2014 12:02    EKG: Independently reviewed. Sinus tachycardia, diminished amplitude. Inverted T waves in V4 through V6 and inferior and lateral leads. Minimal ST segment depression in lateral leads. Evidence of old anteroseptal infarct and left posterior fascicular block. No acute ischemic changes.  Assessment/Plan Active Problems:   Aortic stenosis   Anemia in chronic kidney  disease(285.21)   Atrial fibrillation   Coronary atherosclerosis of native coronary artery   Essential hypertension, benign   Gout   End stage renal disease on dialysis   Lactic acidosis   Altered mental status   Septic shock  ---  Septic vs. Cardiogenic shock with hypotension, elevated lactic acid, and decreased mentation.  Sepsis may be due to HCAP.  Offer the possibility of admission to step down unit with medications to treat infection, IV fluids to assist with low blood pressure, and other minimally invasive treatment of his current medical condition. According to his wife, however, he has had a steady decline and recurrent hospitalizations with severe illness. She understands that he may die during this admission, and she would prefer that he be made as comfortable as possible and be treated only for symptoms of discomfort.  Currently he does not appear anxious, however he does appear to have some shortness of breath and some abdominal discomfort. -  Admit to medical surgical -  Stop IV fluids and antibiotics -  Start Dilaudid and Valium when necessary -  Start Colace, senna, MiraLax when necessary constipation -  Palliative care consultation -  Will defer further blood work at this time  Hypoglycemia, likely related to septic shock and resolved with dextrose fluids -  Regular diet  Pleural effusion secondary to inadequate hemodialysis - Unable to perform thoracentesis at this time due to elevated INR  Lactic acidosis, likely secondary to sepsis and heart failure -  Patient received antibiotics and IV fluids in the emergency department, however these will be discontinued per family request  End-stage renal disease, currently too hypotensive to undergo hemodialysis -  Will defer calling nephrology as this patient is comfort measures only at this time  CAD, HTN, HLD, and PVD -  Hold statin  -  Not on ACEI/ARB or BB due to hypotension -  Not on ASA/plavix due to  coumadin and hx  of GIB -  Stable bilateral AKA  Acute congestive heart failure due to hypervolemia from inadequate HD -  Family declines inotrope support -  No ACEI/BB or HD due to hypotension  Atrial fibrillation, mild tachycardia -  Hold anticoagulation due to supratherapeutic INR -  Monitor heart rate only with routine vital signs  Elevated INR, maybe secondary to inconsistent absorption do to hypervolemic state -  Hold Coumadin  Mild anemia of chronic kidney disease, a hemoglobin greater than 10, stable.  Per family, requires frequent blood transfusions -  No need for transfusion at this time  Gout, stable.  Diet:  regular Access:  PIV and left arm fistula IVF:  off Proph:  supratherapeutic INR  Code Status: DNR, full comfort Family Communication: wife, patient Disposition Plan: Admit to med-surg  Time spent: 60 min [facility] Pager [phone]  If 7PM-7AM, please contact night-coverage [email or web address] Password [facility] 02/22/2014, 3:19 PM

## 2014-02-22 NOTE — ED Provider Notes (Signed)
CSN: [id number]     Arrival date & time 02/22/14  1056 History   First MD Initiated Contact with Patient 02/22/14 1057     Chief Complaint  Patient presents with  . Hypotension     (Consider location/radiation/quality/duration/timing/severity/associated sxs/prior Treatment) HPI  Level 5 -altered mental status  Patient presents to the emergency department by EMS from [facility] skilled nursing facility. He is a dialysis patient with atrial fibrillation and bilateral above-the-knee amputations. It was noted this morning that he was altered from baseline. He then determined that he was hypotensive at 88/56, tachycardic at 106 and hypoxic at 74% on room air. The patient is hard of hearing, drowsy and does not answer my questions directly. He denies having pain anywhere aside from his left middle finger which he says has a sore on it.  Dialysis T/TH/S  Past Medical History  Diagnosis Date  . Aortic stenosis 04/07/2013  . Atherosclerotic peripheral vascular disease with gangrene 04/07/2013  . Atrial fibrillation 04/21/2013  . Coronary atherosclerosis of native coronary artery 07/19/2013  . Acute lower gastrointestinal bleeding 07/19/2013  . Acute posthemorrhagic anemia 07/02/2013  . 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 Pure hypercholesterolemia 04/07/2013  . Essential hypertension, benign 03/10/2013  . Gout 03/10/2013  . End stage renal disease on dialysis 03/10/2013  . Prostate cancer 03/10/2013  . PVD (peripheral vascular disease) 03/10/2013  . Personal history of colonic polyps 03/10/2013  . Glaucoma 03/10/2013  . Post traumatic stress disorder (PTSD) 03/10/2013   Past Surgical History  Procedure Laterality Date  . Aortic valve replacement    . Leg amputation above knee Bilateral     left then right  . Debriderment of aka Left   . Rij      tunneled    History reviewed. No pertinent family history. History  Substance Use Topics  . Smoking status: Former Research scientist (life sciences)  .  Smokeless tobacco: Current User  . Alcohol Use: No    Review of Systems  Level 5 -altered mental status  Allergies  Atorvastatin; Gabapentin; Hectorol; Hydrochlorothiazide; Icodextrin; Losartan; Niacin and related; Nicotine; Phenytoin; Sensipar; Sulfa antibiotics; Vancomycin; and Zetia  Home Medications   Prior to Admission medications   Medication Sig Start Date End Date Taking? Authorizing Provider  acetaminophen (TYLENOL) 325 MG tablet Take 325 mg by mouth every 6 (six) hours as needed (for pain).   Yes Historical Provider, MD  bisacodyl (DULCOLAX) 5 MG EC tablet Take 5 mg by mouth daily as needed for moderate constipation.   Yes Historical Provider, MD  cephALEXin (KEFLEX) 500 MG capsule Take 500 mg by mouth 4 (four) times daily. On dialysis days give after dialysis for 10 days   Yes Historical Provider, MD  docusate sodium (COLACE) 100 MG capsule Take 100 mg by mouth 2 (two) times daily.   Yes Historical Provider, MD  Folic Acid-Vit H3-ZJI R67 (FOLBEE) 2.5-25-1 MG TABS tablet Take 1 tablet by mouth daily.   Yes Historical Provider, MD  lanthanum (FOSRENOL) 500 MG chewable tablet Chew 2,000 mg by mouth 3 (three) times daily with meals.   Yes Historical Provider, MD  megestrol (MEGACE) 40 MG/ML suspension Take 200 mg by mouth daily.   Yes Historical Provider, MD  midodrine (PROAMATINE) 10 MG tablet Take 10 mg by mouth 3 (three) times daily.    Yes Historical Provider, MD  ondansetron (ZOFRAN) 4 MG tablet Take 4 mg by mouth every 8 (eight) hours as needed for nausea or vomiting.   Yes Historical Provider,  MD  pantoprazole (PROTONIX) 40 MG tablet Take 40 mg by mouth daily.   Yes Historical Provider, MD  pravastatin (PRAVACHOL) 40 MG tablet Take 40 mg by mouth at bedtime. For cholesterol   Yes Historical Provider, MD  traMADol (ULTRAM) 50 MG tablet Take one tablet by mouth every 6 hours as needed for pain 02/10/14  Yes Monina C Medina-Vargas, NP  warfarin (COUMADIN) 4 MG tablet Take 4 mg by  mouth daily.   Yes Historical Provider, MD   BP 103/84  Pulse 113  Temp(Src) 97.7 F (36.5 C) (Oral)  Resp 29  SpO2 100% Physical Exam  Nursing note and vitals reviewed. Constitutional: He appears well-developed and well-nourished. No distress.  HENT:  Head: Normocephalic and atraumatic.  Eyes: Pupils are equal, round, and reactive to light.  Neck: Normal range of motion. Neck supple.  Cardiovascular: Normal rate and regular rhythm.   Pulmonary/Chest: Effort normal.  Abdominal: Soft.  Musculoskeletal:  Bilateral above the knee ambutation Wound to left middle finger  Neurological: He is alert.  Unable to assess neurological exam due to patient not following commands and being altered.  Skin: Skin is warm and dry.      ED Course  Procedures (including critical care time) Labs Review Labs Reviewed  CBC WITH DIFFERENTIAL - Abnormal; Notable for the following:    RBC 3.52 (*)    Hemoglobin 10.4 (*)    HCT 34.3 (*)    RDW 24.8 (*)    All other components within normal limits  COMPREHENSIVE METABOLIC PANEL - Abnormal; Notable for the following:    Chloride 92 (*)    CO2 17 (*)    Glucose, Bld 22 (*)    BUN 46 (*)    Creatinine, Ser 4.83 (*)    Calcium 10.7 (*)    AST 343 (*)    ALT 161 (*)    Alkaline Phosphatase 124 (*)    Total Bilirubin 1.5 (*)    GFR calc non Af Amer 11 (*)    GFR calc Af Amer 13 (*)    All other components within normal limits  PROTIME-INR - Abnormal; Notable for the following:    Prothrombin Time 47.7 (*)    INR 5.58 (*)    All other components within normal limits  PRO B NATRIURETIC PEPTIDE - Abnormal; Notable for the following:    Pro B Natriuretic peptide (BNP) >70000.0 (*)    All other components within normal limits  I-STAT TROPOININ, ED - Abnormal; Notable for the following:    Troponin i, poc 0.74 (*)    All other components within normal limits  I-STAT CG4 LACTIC ACID, ED - Abnormal; Notable for the following:    Lactic Acid,  Venous 10.06 (*)    All other components within normal limits  AMMONIA  LACTIC ACID, PLASMA    Imaging Review Dg Chest Port 1 View  02/22/2014   CLINICAL DATA:  Hypotensive and tachycardic.  Shortness breath.  EXAM: PORTABLE CHEST - 1 VIEW  COMPARISON:  CT chest 02/01/2014  FINDINGS: The heart is enlarged. The patient is status post median sternotomy for aortic valve replacement. Right pleural effusion has increased. Mild edema is worse on the right. The lung volumes are low. Minimal atelectasis is present at the left base. The visualized soft tissues and bony thorax are otherwise unremarkable.  IMPRESSION: 1. Scratch the persistent and likely progressive congestive heart failure. 2. Progressive right pleural effusion and asymmetric right-sided edema. 3. Low lung volumes.  Electronically Signed   By: Lawrence Santiago M.D.   On: 02/22/2014 12:02     EKG Interpretation   Date/Time:  Wednesday February 22 2014 10:56:34 EDT Ventricular Rate:  108 PR Interval:  137 QRS Duration: 114 QT Interval:  376 QTC Calculation: 504 R Axis:   101 Text Interpretation:  Age not entered, assumed to be  68 years old for  purpose of ECG interpretation Sinus tachycardia Ventricular premature  complex Left posterior fascicular block Low voltage, extremity and  precordial leads Anteroseptal infarct, old Borderline repolarization  abnormality Sinus tachycardia Artifact T wave abnormality Non-specific  intra-ventricular conduction delay Abnormal ekg Prolonged QT interval  Confirmed by Carmin Muskrat  MD [phone]) on 02/22/2014 11:13:00 AM      MDM   Final diagnoses:  Pleural effusion, right  Lactic acidosis    Patient seen by Dr. Vanita Panda as soon as I left the room.  Lactate results at 10, IV Vanc and Zosyn initiated as well as 500 ml/hr for 3 hours of fluids.  CRITICAL CARE Performed by: Linus Mako Total critical care time: 35 Critical care time was exclusive of separately billable procedures and  treating other patients. Critical care was necessary to treat or prevent imminent or life-threatening deterioration. Critical care was time spent personally by me on the following activities: development of treatment plan with patient and/or surrogate as well as nursing, discussions with consultants, evaluation of patient's response to treatment, examination of patient, obtaining history from patient or surrogate, ordering and performing treatments and interventions, ordering and review of laboratory studies, ordering and review of radiographic studies, pulse oximetry and re-evaluation of patient's condition.  12:09 pm Patient has been here 1 hour and doctor Vanita Panda has spoken with critical care who has agreed to bring him on their service. Pt will need therapeutic thoracentesis to remove effusion of right lung. Critical Care to assume care.    Linus Mako, PA-C 02/22/14 1227

## 2014-02-22 NOTE — Consult Note (Signed)
PULMONARY / CRITICAL CARE MEDICINE   Name: Stephen Dyer MRN: [id number] DOB: 09-02-1946    ADMISSION DATE:  02/22/2014 CONSULTATION DATE:  02/22/14  REFERRING MD :  [facility]P PRIMARY SERVICE: [facility]   CHIEF COMPLAINT:  Hypotension, lactic acidosis   BRIEF PATIENT DESCRIPTION: 68yo male with extensive PMH including ESRD, PVD s/p bilat AKA, Afib recent admit to [facility] with PNA, coagulapathy presented 6/3 with AMS, hypotension, hypoglycemia, lactate 10.  PCCM called to assess for ICU admission.   SIGNIFICANT EVENTS / STUDIES:  CT chest 6/3>>> CT head 6/3>>>  LINES / TUBES:   CULTURES: BCx2 6/3>>>   ANTIBIOTICS: vanc 6/3>>> Zosyn 6/3>>>  HISTORY OF PRESENT ILLNESS:  68yo male with extensive PMH including ESRD, PVD s/p bilat AKA, Afib recent admit to [facility] with PNA, coagulapathy presented 6/3 with AMS, hypotension, hypoglycemia.  Per wife has significant chronic pain and has been requiring much more pain rx. Pt endorses pre-syncope several times throughout the night, then was disoriented and lethargic this am when CNA checked on him, found to be hypotensive, hypoglycemic with CBG 22 in ER. Per wife has been declining overall, more frequent hospital admissions, worsening sacral decub.  Reportedly has had ?ascites with paracentesis several times last hospital admit. Pt more awake in ER, denies chest pain, SOB, abd pain, n/v/d, headache. Does c/o LUE discomfort and "cold".   PAST MEDICAL HISTORY :  Past Medical History  Diagnosis Date  . Aortic stenosis 04/07/2013  . Atherosclerotic peripheral vascular disease with gangrene 04/07/2013  . Atrial fibrillation 04/21/2013  . Coronary atherosclerosis of native coronary artery 07/19/2013  . Acute lower gastrointestinal bleeding 07/19/2013  . Acute posthemorrhagic anemia 07/02/2013  . 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 Pure hypercholesterolemia 04/07/2013  . Essential hypertension, benign 03/10/2013  . Gout 03/10/2013  . End stage renal  disease on dialysis 03/10/2013  . Prostate cancer 03/10/2013  . PVD (peripheral vascular disease) 03/10/2013  . Personal history of colonic polyps 03/10/2013  . Glaucoma 03/10/2013  . Post traumatic stress disorder (PTSD) 03/10/2013   Past Surgical History  Procedure Laterality Date  . Aortic valve replacement    . Leg amputation above knee Bilateral     left then right  . Debriderment of aka Left   . Rij      tunneled    Prior to Admission medications   Medication Sig Start Date End Date Taking? Authorizing Provider  acetaminophen (TYLENOL) 325 MG tablet Take 325 mg by mouth every 6 (six) hours as needed (for pain).   Yes Historical Provider, MD  bisacodyl (DULCOLAX) 5 MG EC tablet Take 5 mg by mouth daily as needed for moderate constipation.   Yes Historical Provider, MD  cephALEXin (KEFLEX) 500 MG capsule Take 500 mg by mouth 4 (four) times daily. On dialysis days give after dialysis for 10 days   Yes Historical Provider, MD  docusate sodium (COLACE) 100 MG capsule Take 100 mg by mouth 2 (two) times daily.   Yes Historical Provider, MD  Folic Acid-Vit [id number] [id number] (FOLBEE) 2.5-25-1 MG TABS tablet Take 1 tablet by mouth daily.   Yes Historical Provider, MD  lanthanum (FOSRENOL) 500 MG chewable tablet Chew 2,000 mg by mouth 3 (three) times daily with meals.   Yes Historical Provider, MD  megestrol (MEGACE) 40 MG/ML suspension Take 200 mg by mouth daily.   Yes Historical Provider, MD  midodrine (PROAMATINE) 10 MG tablet Take 10 mg by mouth 3 (three) times daily.    Yes Historical Provider,  MD  ondansetron (ZOFRAN) 4 MG tablet Take 4 mg by mouth every 8 (eight) hours as needed for nausea or vomiting.   Yes Historical Provider, MD  pantoprazole (PROTONIX) 40 MG tablet Take 40 mg by mouth daily.   Yes Historical Provider, MD  pravastatin (PRAVACHOL) 40 MG tablet Take 40 mg by mouth at bedtime. For cholesterol   Yes Historical Provider, MD  traMADol (ULTRAM) 50 MG tablet Take one tablet by mouth  every 6 hours as needed for pain 02/10/14  Yes Monina C Medina-Vargas, NP  warfarin (COUMADIN) 4 MG tablet Take 4 mg by mouth daily.   Yes Historical Provider, MD   Allergies  Allergen Reactions  . Atorvastatin     Unknown   . Gabapentin     Unknown   . Hectorol [Doxercalciferol]     Unknown   . Hydrochlorothiazide     Unknown   . Icodextrin [Dextrin]     Unknown   . Losartan     Unknown   . Niacin And Related     Unknown   . Nicotine     Unknown   . Phenytoin     Unknown   . Sensipar [Cinacalcet]     Unknown   . Sulfa Antibiotics     Unknown   . Vancomycin     Unknown   . Zetia [Ezetimibe]     Unknown     FAMILY HISTORY:  History reviewed. No pertinent family history. SOCIAL HISTORY:  reports that he has quit smoking. He uses smokeless tobacco. He reports that he does not drink alcohol or use illicit drugs.  REVIEW OF SYSTEMS:  As per HPI - All other systems reviewed and were neg.    VITAL SIGNS: Temp:  [97.7 F (36.5 C)] 97.7 F (36.5 C) (06/03 1056) Pulse Rate:  [35-113] 109 (06/03 1400) Resp:  [10-29] 16 (06/03 1404) BP: (52-116)/(30-87) 64/45 mmHg (06/03 1404) SpO2:  [74 %-100 %] 100 % (06/03 1400) HEMODYNAMICS:   VENTILATOR SETTINGS:   INTAKE / OUTPUT: Intake/Output   None     PHYSICAL EXAMINATION: General:  Chronically ill appearing male, intermittently lethargic, NAD Neuro:  Awake at times, interactive, mixed with lethargy.  MAE, gen weakness  HEENT:  Mm moist, no JVD  Cardiovascular:   Lungs:  s1s2 irreg, soft murmur  Abdomen:  Soft, +bs Musculoskeletal:  Warm and dry, no sig edema, bilat AKA, LUE warm , graft +thrill   LABS:  CBC  Recent Labs Lab 02/22/14 1122  WBC 5.9  HGB 10.4*  HCT 34.3*  PLT 186   Coag's  Recent Labs Lab 02/22/14 1122  INR 5.58*   BMET  Recent Labs Lab 02/22/14 1122  NA 143  K 4.6  CL 92*  CO2 17*  BUN 46*  CREATININE 4.83*  GLUCOSE 22*   Electrolytes  Recent Labs Lab 02/22/14 1122   CALCIUM 10.7*   Sepsis Markers  Recent Labs Lab 02/22/14 1132  LATICACIDVEN 10.06*   ABG No results found for this basename: PHART, PCO2ART, PO2ART,  in the last 168 hours Liver Enzymes  Recent Labs Lab 02/22/14 1122  AST 343*  ALT 161*  ALKPHOS 124*  BILITOT 1.5*  ALBUMIN 4.1   Cardiac Enzymes  Recent Labs Lab 02/22/14 1122  PROBNP >70000.0*   Glucose  Recent Labs Lab 02/22/14 1226 02/22/14 1255 02/22/14 1355  GLUCAP 12* 85 108*    Imaging Ct Head Wo Contrast  02/22/2014   CLINICAL DATA:  Hypotensive, altered mental status  EXAM: CT HEAD WITHOUT CONTRAST  TECHNIQUE: Contiguous axial images were obtained from the base of the skull through the vertex without intravenous contrast.  COMPARISON:  None.  FINDINGS: No evidence of parenchymal hemorrhage or extra-axial fluid collection. No mass lesion, mass effect, or midline shift.  No CT evidence of acute infarction.  Subcortical white matter and periventricular small vessel ischemic changes. Intracranial atherosclerosis.  Global cortical atrophy.  No ventriculomegaly.  The visualized paranasal sinuses are essentially clear. The mastoid air cells are unopacified.  No evidence of calvarial fracture.  IMPRESSION: No evidence of acute intracranial abnormality.  Atrophy with small vessel ischemic changes and intracranial atherosclerosis.   Electronically Signed   By: Julian Hy M.D.   On: 02/22/2014 12:53   Ct Chest Wo Contrast  02/22/2014   CLINICAL DATA:  Hypoxia and tachycardia.  EXAM: CT CHEST WITHOUT CONTRAST  TECHNIQUE: Multidetector CT imaging of the chest was performed following the standard protocol without IV contrast.  COMPARISON:  CT chest 02/01/2014 and 09/23/2012. Plain film of the chest earlier the same day and 01/29/2014.  FINDINGS: A large right pleural effusion is identified. Small right pleural effusion is also seen. There is no pericardial effusion. Heart size is upper normal with atrial enlargement. There  is extensive aortic and coronary atherosclerotic vascular disease. No axillary, hilar or mediastinal lymphadenopathy is identified. 0.6 cm left upper lobe nodule is unchanged on image 19, unchanged since the 2014 exam. Compressive atelectasis on the right the lungs are otherwise clear. Instantly imaged upper abdomen demonstrates no focal abnormality.  IMPRESSION: Large right and very small left pleural effusion.  Cardiomegaly without evidence of pulmonary edema  Extensive atherosclerosis.  Unchanged 0.6 cm left upper lobe pulmonary nodule.   Electronically Signed   By: Inge Rise M.D.   On: 02/22/2014 13:00   Dg Chest Port 1 View  02/22/2014   CLINICAL DATA:  Hypotensive and tachycardic.  Shortness breath.  EXAM: PORTABLE CHEST - 1 VIEW  COMPARISON:  CT chest 02/01/2014  FINDINGS: The heart is enlarged. The patient is status post median sternotomy for aortic valve replacement. Right pleural effusion has increased. Mild edema is worse on the right. The lung volumes are low. Minimal atelectasis is present at the left base. The visualized soft tissues and bony thorax are otherwise unremarkable.  IMPRESSION: 1. Scratch the persistent and likely progressive congestive heart failure. 2. Progressive right pleural effusion and asymmetric right-sided edema. 3. Low lung volumes.   Electronically Signed   By: Lawrence Santiago M.D.   On: 02/22/2014 12:02     ASSESSMENT / PLAN:  PULMONARY R pleural effusion - ?hemothorax, no noted trauma but coagulopathic  Known pulmonary nodule - stable  P:   Ct chest no contrast - eval effusion v ?blood  Consider reversal INR and diagnostic thora - hold for now with stable resp status - let INR trend down then eval need thora Volume removal with HD as bp tolerates   CARDIOVASCULAR Hypotension - ?sepsis  Afib  P:  Hold coumadin as below  No CVL, no pressors  F/u lactate  ?midodrine   RENAL ESRD  P:   Renal to see     GASTROINTESTINAL transaminitis  ?ascites   P:   F/u LFT's   HEMATOLOGIC Coagulopathy - coumadin admin  P:  Hold coumadin  F/u cbc  SCD's   INFECTIOUS ?sepsis - ?HCAP P:   Pan culture  Broad spectrum abx as above  F/u lactate   ENDOCRINE Hypoglycemia  P:  d10   NEUROLOGIC AMS - in setting hypoglycemia, hypotension, narcotics  P:   Narcan x 1  Avoid sedating meds  rx hypoglycemia as above    Discussed with patient, family at length at bedside.  Pt has made it very clear in the past that he would not want aggressive care including life support, CVL, pressors.  They wish to continue full medical care and dialysis but want the pt to be a full DNR.  [place] for admission to SDU per [facility].  PCCM will continue to f/u for pleural effusion.   Nickolas Madrid, NP 02/22/2014  2:23 PM Pager: [phone] or [phone]  Patient has been slowly deteriorating for the past year.  He has made himself DNR on previous admission in an outside facility.  Based on discussion, the patient will be made a full DNR, no intubation/CPR/cardioversion, that would also mean to central access and no pressors.  I am concerned that due to high INR and head injury may have an intracranial event and that the pleural effusion could be blood.  Head/Chest CT noted, no thora at this time.  Recommend involvement of palliative care.  PCCM will sign off, please call back if needed.  I have personally obtained a history, examined the patient, evaluated laboratory and imaging results, formulated the assessment and plan and placed orders.  CRITICAL CARE: The patient is critically ill with multiple organ systems failure and requires high complexity decision making for assessment and support, frequent evaluation and titration of therapies, application of advanced monitoring technologies and extensive interpretation of multiple databases. Critical Care Time devoted to patient care services described in this note is 40 minutes.   Rush Farmer, M.D. [facility]  Pulmonary/Critical Care Medicine. Pager: [phone]. After hours pager: [phone].

## 2014-02-22 NOTE — ED Notes (Signed)
Results of lactic acid and troponin given to Dr. Vanita Panda

## 2014-02-23 DIAGNOSIS — Z992 Dependence on renal dialysis: Secondary | ICD-10-CM

## 2014-02-23 DIAGNOSIS — I4891 Unspecified atrial fibrillation: Secondary | ICD-10-CM

## 2014-02-23 DIAGNOSIS — N186 End stage renal disease: Secondary | ICD-10-CM

## 2014-02-23 DIAGNOSIS — Z515 Encounter for palliative care: Secondary | ICD-10-CM

## 2014-02-23 MED ORDER — GERHARDT'S BUTT CREAM
TOPICAL_CREAM | CUTANEOUS | Status: DC | PRN
  Filled 2014-02-23: qty 1

## 2014-02-23 MED ORDER — ATROPINE SULFATE 1 % OP SOLN
2.0000 [drp] | OPHTHALMIC | Status: DC | PRN
  Filled 2014-02-23: qty 2

## 2014-02-23 MED ORDER — HYDROMORPHONE HCL PF 1 MG/ML IJ SOLN
1.0000 mg | INTRAMUSCULAR | Status: DC | PRN
  Administered 2014-02-23 – 2014-02-24 (×5): 1 mg via INTRAVENOUS
  Filled 2014-02-23 (×5): qty 1

## 2014-02-23 NOTE — Progress Notes (Signed)
Patient ID: Jaidyn Kuhl, male   DOB: 03-18-1946, 68 y.o.   MRN: [id number]            PROGRESS NOTE  DATE: 02/20/2014       FACILITY:  [facility]  LEVEL OF CARE: SNF (31)  Acute Visit  CHIEF COMPLAINT:  Manage anemia of chronic kidney disease.    HISTORY OF PRESENT ILLNESS: I was requested by the staff to assess the patient regarding above problem(s):  ANEMIA: The anemia has been stable. The patient denies fatigue, melena or hematochezia. No complications from the medications currently being used.  On 02/17/2014:  Hemoglobin 9.4, MCV 91.  In 07/2013:  Hemoglobin 7.6.    PAST MEDICAL HISTORY : Reviewed.  No changes/see problem list  CURRENT MEDICATIONS: Reviewed per MAR/see medication list  PHYSICAL EXAMINATION  VS:  T 98.6       P 74      RR 18      BP 104/70     POX 95%       WT (Lb) 148       GENERAL: no acute distress, normal body habitus RESPIRATORY: breathing is even & unlabored, BS CTAB CARDIAC: RRR, no murmur,no extra heart sounds, no edema  ASSESSMENT/PLAN:  Anemia of chronic kidney disease.  Hemoglobin improved.   Monitor.    CPT CODE: 50932        Gayani Y Dasanayaka, [place] [phone]

## 2014-02-23 NOTE — Progress Notes (Signed)
Utilization review completed. Leoma Folds, RN, BSN. 

## 2014-02-23 NOTE — Consult Note (Addendum)
Met with patient and his wife to discuss goals of care.   Full comfort  No additional HD, patient extremely hypotensive and debilitated.  Refer to [facility] and [facility] 2nd-they are from [place], [place]  Prognosis: <10 days  Started PRN Hydromorphone, Valium, Atropine Gtt, and topical barrier cream  Full consult note to follow.  Lane Hacker, DO Palliative Medicine

## 2014-02-23 NOTE — Progress Notes (Signed)
[facility] PROGRESS NOTE  Stephen Dyer IOE:[id number] DOB: Jul 07, 1946 DOA: 02/22/2014  PCP: Elenora Fender, NP  Brief HPI: The patient is a 68 y.o. year-old male with history of CAD, CHF, hypertension, hyperlipidemia, atrial fibrillation on Coumadin anticoagulation, peripheral vascular disease status post above-the-knee amputations bilaterally, gout, end-stage renal disease on hemodialysis on Tuesday, Thursday, Saturday, glaucoma, PTSD who presents with decreased level of consciousness. He has not been able to tolerate hemodialysis secondary to low blood pressures. Because of his inability to tolerate hemodialysis, he has had recurrent accumulation of ascites and pleural effusions. He was hospitalized at [facility] recently. On day of admission he was found to have decreased level of consciousness at [facility] and his blood pressure was as low as the 40s/20s. He was awake and responsive and oriented x4. He was initially evaluated by PCCM but wife did not want aggressive measures and he was admitted to Hospitalists service with comfort care.  Past medical history:  Past Medical History  Diagnosis Date  . Aortic stenosis 04/07/2013  . Atherosclerotic peripheral vascular disease with gangrene 04/07/2013  . Atrial fibrillation 04/21/2013  . Coronary atherosclerosis of native coronary artery 07/19/2013  . Acute lower gastrointestinal bleeding 07/19/2013  . Acute posthemorrhagic anemia 07/02/2013  . 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 Pure hypercholesterolemia 04/07/2013  . Essential hypertension, benign 03/10/2013  . Gout 03/10/2013  . End stage renal disease on dialysis 03/10/2013  . Prostate cancer 03/10/2013  . PVD (peripheral vascular disease) 03/10/2013  . Personal history of colonic polyps 03/10/2013  . Glaucoma 03/10/2013  . Post traumatic stress disorder (PTSD) 03/10/2013    Consultants: Palliative Care  Procedures: None  Antibiotics: None  Subjective: Patient  awake and alert. Complains of feeling bloated in abdomen and slightly nauseas. Wife is at bedside.  Objective: Vital Signs  Filed Vitals:   02/22/14 1745 02/22/14 2053 02/23/14 0651 02/23/14 0830  BP: 64/18 83/55 53/22  87/59  Pulse: 102 120 100 98  Temp: 97.9 F (36.6 C) 98 F (36.7 C) 98.1 F (36.7 C) 98.4 F (36.9 C)  TempSrc: Oral Oral Oral Oral  Resp: 20 20 22 22   SpO2: 100% 97% 100% 100%    Intake/Output Summary (Last 24 hours) at 02/23/14 1210 Last data filed at 02/23/14 1000  Gross per 24 hour  Intake    230 ml  Output      0 ml  Net    230 ml    General appearance: alert, cooperative, no distress and uncomfortable Resp: Decreased air entry on right. Crackles at bases. Cardio: regular rate and rhythm, S1, S2 normal, no murmur, click, rub or gallop GI: distended, non tender, BS present. No masses. Extremities: bilateral AKA  Lab Results:  Basic Metabolic Panel:  Recent Labs Lab 02/22/14 1122  NA 143  K 4.6  CL 92*  CO2 17*  GLUCOSE 22*  BUN 46*  CREATININE 4.83*  CALCIUM 10.7*   Liver Function Tests:  Recent Labs Lab 02/22/14 1122  AST 343*  ALT 161*  ALKPHOS 124*  BILITOT 1.5*  PROT 8.0  ALBUMIN 4.1   No results found for this basename: LIPASE, AMYLASE,  in the last 168 hours  Recent Labs Lab 02/22/14 1122  AMMONIA 54   CBC:  Recent Labs Lab 02/22/14 1122  WBC 5.9  NEUTROABS 4.6  HGB 10.4*  HCT 34.3*  MCV 97.4  PLT 186   Cardiac Enzymes: No results found for this basename: CKTOTAL, CKMB, CKMBINDEX, TROPONINI,  in  the last 168 hours BNP (last 3 results)  Recent Labs  02/22/14 1122  PROBNP >70000.0*   CBG:  Recent Labs Lab 02/22/14 1226 02/22/14 1255 02/22/14 1355  GLUCAP 12* 85 108*    No results found for this or any previous visit (from the past 240 hour(s)).    Studies/Results: Ct Head Wo Contrast  02/22/2014   CLINICAL DATA:  Hypotensive, altered mental status  EXAM: CT HEAD WITHOUT CONTRAST  TECHNIQUE:  Contiguous axial images were obtained from the base of the skull through the vertex without intravenous contrast.  COMPARISON:  None.  FINDINGS: No evidence of parenchymal hemorrhage or extra-axial fluid collection. No mass lesion, mass effect, or midline shift.  No CT evidence of acute infarction.  Subcortical white matter and periventricular small vessel ischemic changes. Intracranial atherosclerosis.  Global cortical atrophy.  No ventriculomegaly.  The visualized paranasal sinuses are essentially clear. The mastoid air cells are unopacified.  No evidence of calvarial fracture.  IMPRESSION: No evidence of acute intracranial abnormality.  Atrophy with small vessel ischemic changes and intracranial atherosclerosis.   Electronically Signed   By: Julian Hy M.D.   On: 02/22/2014 12:53   Ct Chest Wo Contrast  02/22/2014   CLINICAL DATA:  Hypoxia and tachycardia.  EXAM: CT CHEST WITHOUT CONTRAST  TECHNIQUE: Multidetector CT imaging of the chest was performed following the standard protocol without IV contrast.  COMPARISON:  CT chest 02/01/2014 and 09/23/2012. Plain film of the chest earlier the same day and 01/29/2014.  FINDINGS: A large right pleural effusion is identified. Small right pleural effusion is also seen. There is no pericardial effusion. Heart size is upper normal with atrial enlargement. There is extensive aortic and coronary atherosclerotic vascular disease. No axillary, hilar or mediastinal lymphadenopathy is identified. 0.6 cm left upper lobe nodule is unchanged on image 19, unchanged since the 2014 exam. Compressive atelectasis on the right the lungs are otherwise clear. Instantly imaged upper abdomen demonstrates no focal abnormality.  IMPRESSION: Large right and very small left pleural effusion.  Cardiomegaly without evidence of pulmonary edema  Extensive atherosclerosis.  Unchanged 0.6 cm left upper lobe pulmonary nodule.   Electronically Signed   By: Inge Rise M.D.   On: 02/22/2014  13:00   Dg Chest Port 1 View  02/22/2014   CLINICAL DATA:  Hypotensive and tachycardic.  Shortness breath.  EXAM: PORTABLE CHEST - 1 VIEW  COMPARISON:  CT chest 02/01/2014  FINDINGS: The heart is enlarged. The patient is status post median sternotomy for aortic valve replacement. Right pleural effusion has increased. Mild edema is worse on the right. The lung volumes are low. Minimal atelectasis is present at the left base. The visualized soft tissues and bony thorax are otherwise unremarkable.  IMPRESSION: 1. Scratch the persistent and likely progressive congestive heart failure. 2. Progressive right pleural effusion and asymmetric right-sided edema. 3. Low lung volumes.   Electronically Signed   By: Lawrence Santiago M.D.   On: 02/22/2014 12:02    Medications:  Scheduled: . antiseptic oral rinse  15 mL Mouth Rinse BID  . docusate sodium  100 mg Oral BID  . pantoprazole  40 mg Oral Daily   Continuous:  [id number], bisacodyl, diazepam, HYDROmorphone (DILAUDID) injection, ondansetron (ZOFRAN) IV, ondansetron, polyethylene glycol  Assessment/Plan:  Active Problems:   Aortic stenosis   Anemia in chronic kidney disease(285.21)   Atrial fibrillation   Coronary atherosclerosis of native coronary artery   Essential hypertension, benign   Gout   End stage renal  disease on dialysis   Lactic acidosis   Altered mental status   Septic shock    Hypotension with elevated lactic acid, and decreased mentation This could be sepsis but remains unclear. Sepsis may be due to HCAP but source not clear. Family has chosen comfort care. BP slightly better but remains low. Discussed with Dr. Hilma Favors and she will discuss further with patient and family. May have to pursue residential hospice if he stabilizes.  Hypoglycemia, likely related to septic shock and resolved with dextrose fluids  Regular diet   Pleural effusion secondary to inadequate hemodialysis  Large right sided pleural effusion noted on  CT. Unable to perform thoracentesis at this time due to elevated INR. This will likely be a recurrent issue if he cannot undergo dialysis. He does not seem overtly dyspneic at this time.  Lactic acidosis, likely secondary to hypotension Patient received antibiotics and IV fluids in the emergency department, however these were discontinued per family request   End-stage renal disease, currently too hypotensive to undergo hemodialysis  No dialysis to be pursued. Hold off on consulting Nephrology.   CAD, HTN, HLD, and PVD  Holding medications due to low BP. Stable bilateral AKA   Acute congestive heart failure due to hypervolemia from inadequate HD  Family declined inotrope support. No ACEI/BB or HD due to hypotension   Atrial fibrillation, mild tachycardia  Hold anticoagulation due to supratherapeutic INR. Monitor heart rate only with routine vital signs. May consider discontinuing warfarin altogether depending on PMT input.   Mild anemia of chronic kidney disease, a hemoglobin greater than 10, stable. Per family, requires frequent blood transfusions  No need for transfusion at this time   Gout Stable.   Access: PIV and left arm fistula   DVT Prophylaxis: supratherapeutic INR  Code Status: DNR, full comfort  Family Communication: wife, patient  Disposition Plan: From SNF but unclear if he will go back there.    LOS: 1 day   [facility] Pager [phone] 02/23/2014, 12:10 PM  If 8PM-8AM, please contact night-coverage at [email or web address], password [email or web address]   Disclaimer: This note was dictated with voice recognition software. Similar sounding words can inadvertently be transcribed and may not be corrected upon review.

## 2014-02-23 NOTE — Clinical Social Work Note (Signed)
CSW received consult from Dr. Hilma Favors for Hospice home placement for patient. The family is requesting:  [facility] and [facility]. Call made to [facility] [phone] and message left for Caryl Pina in the referral unit.  CSW will follow up with Hospice facilities on 6/5.  Stephen Dyer, MSW, LCSW [phone]

## 2014-02-23 NOTE — Progress Notes (Signed)
Pt cx of pain to left 3rd finger, finger is tender to touch. Washed per family request with betadine swab and wrapped with dry gauze. Pt has increased swelling to penis and scrotum, non-pitting. Elevated on scrotum on towel. Will continue to monitor.

## 2014-02-24 DIAGNOSIS — K5900 Constipation, unspecified: Secondary | ICD-10-CM

## 2014-02-24 MED ORDER — HYDROMORPHONE HCL PF 1 MG/ML IJ SOLN: 1.0000 mg | INTRAMUSCULAR | Status: AC | PRN

## 2014-02-24 MED ORDER — DIAZEPAM 5 MG/ML IJ SOLN: 2.5000 mg | INTRAMUSCULAR | Status: AC | PRN

## 2014-02-24 MED ORDER — ATROPINE SULFATE 1 % OP SOLN: 2.0000 [drp] | OPHTHALMIC | Status: AC | PRN

## 2014-02-24 MED ORDER — ONDANSETRON HCL 4 MG/2ML IJ SOLN: 4.0000 mg | Freq: Four times a day (QID) | INTRAMUSCULAR | Status: AC | PRN

## 2014-02-24 MED ORDER — POLYETHYLENE GLYCOL 3350 17 G PO PACK: 17.0000 g | PACK | Freq: Two times a day (BID) | ORAL | Status: AC

## 2014-02-24 MED ORDER — POLYETHYLENE GLYCOL 3350 17 G PO PACK
17.0000 g | PACK | Freq: Two times a day (BID) | ORAL | Status: DC
  Administered 2014-02-24: 17 g via ORAL
  Filled 2014-02-24 (×2): qty 1

## 2014-02-24 MED ORDER — FLEET ENEMA 7-19 GM/118ML RE ENEM
1.0000 | ENEMA | Freq: Once | RECTAL | Status: DC
  Filled 2014-02-24: qty 1

## 2014-02-24 MED ORDER — WHITE PETROLATUM GEL
Status: AC
  Administered 2014-02-24: 0.2
  Filled 2014-02-24: qty 5

## 2014-02-24 NOTE — Clinical Social Work Note (Signed)
Patient will discharge to [facility] today. Hospice representative came to hospital and met with wife to complete admissions paperwork. Patient will be transported to hospice facility by ambulance.  Stephen Dyer, MSW, LCSW [phone]

## 2014-02-24 NOTE — Progress Notes (Signed)
[facility] PROGRESS NOTE  Stephen Dyer OBS:[id number] DOB: Oct 14, 1945 DOA: 02/22/2014  PCP: Elenora Fender, NP  Brief HPI: The patient is a 68 y.o. year-old male with history of CAD, CHF, hypertension, hyperlipidemia, atrial fibrillation on Coumadin anticoagulation, peripheral vascular disease status post above-the-knee amputations bilaterally, gout, end-stage renal disease on hemodialysis on Tuesday, Thursday, Saturday, glaucoma, PTSD who presents with decreased level of consciousness. He has not been able to tolerate hemodialysis secondary to low blood pressures. Because of his inability to tolerate hemodialysis, he has had recurrent accumulation of ascites and pleural effusions. He was hospitalized at [facility] recently. On day of admission he was found to have decreased level of consciousness at [facility] and his blood pressure was as low as the 40s/20s. He was awake and responsive and oriented x4. He was initially evaluated by PCCM but wife did not want aggressive measures and he was admitted to Hospitalists service with comfort care.  Past medical history:  Past Medical History  Diagnosis Date  . Aortic stenosis 04/07/2013  . Atherosclerotic peripheral vascular disease with gangrene 04/07/2013  . Atrial fibrillation 04/21/2013  . Coronary atherosclerosis of native coronary artery 07/19/2013  . Acute lower gastrointestinal bleeding 07/19/2013  . Acute posthemorrhagic anemia 07/02/2013  . 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 Pure hypercholesterolemia 04/07/2013  . Essential hypertension, benign 03/10/2013  . Gout 03/10/2013  . End stage renal disease on dialysis 03/10/2013  . Prostate cancer 03/10/2013  . PVD (peripheral vascular disease) 03/10/2013  . Personal history of colonic polyps 03/10/2013  . Glaucoma 03/10/2013  . Post traumatic stress disorder (PTSD) 03/10/2013    Consultants: Palliative Care  Procedures: None  Antibiotics: None  Subjective: Patient  states he hasn't had a BM yet. Still feels bloated.   Objective: Vital Signs  Filed Vitals:   02/23/14 1551 02/23/14 1744 02/23/14 2057 02/24/14 0905  BP: 67/42 112/55 97/67 84/54   Pulse:  88 98 99  Temp:  98.9 F (37.2 C) 98.3 F (36.8 C) 98 F (36.7 C)  TempSrc:  Oral Oral Tympanic  Resp:  18 18 18   Height:      Weight:   67.132 kg (148 lb)   SpO2:  96% 100% 95%    Intake/Output Summary (Last 24 hours) at 02/24/14 1115 Last data filed at 02/24/14 0900  Gross per 24 hour  Intake    720 ml  Output      0 ml  Net    720 ml    General appearance: alert, cooperative, no distress and uncomfortable Resp: Decreased air entry on right. Crackles at bases. Cardio: regular rate and rhythm, S1, S2 normal, no murmur, click, rub or gallop GI: distended, non tender, BS sluggish. No masses. Extremities: bilateral AKA  Lab Results:  Basic Metabolic Panel:  Recent Labs Lab 02/22/14 1122  NA 143  K 4.6  CL 92*  CO2 17*  GLUCOSE 22*  BUN 46*  CREATININE 4.83*  CALCIUM 10.7*   Liver Function Tests:  Recent Labs Lab 02/22/14 1122  AST 343*  ALT 161*  ALKPHOS 124*  BILITOT 1.5*  PROT 8.0  ALBUMIN 4.1    Recent Labs Lab 02/22/14 1122  AMMONIA 54   CBC:  Recent Labs Lab 02/22/14 1122  WBC 5.9  NEUTROABS 4.6  HGB 10.4*  HCT 34.3*  MCV 97.4  PLT 186   BNP (last 3 results)  Recent Labs  02/22/14 1122  PROBNP >70000.0*   CBG:  Recent Labs Lab 02/22/14  1226 02/22/14 1255 02/22/14 1355  GLUCAP 12* 85 108*    Studies/Results: Ct Head Wo Contrast  02/22/2014   CLINICAL DATA:  Hypotensive, altered mental status  EXAM: CT HEAD WITHOUT CONTRAST  TECHNIQUE: Contiguous axial images were obtained from the base of the skull through the vertex without intravenous contrast.  COMPARISON:  None.  FINDINGS: No evidence of parenchymal hemorrhage or extra-axial fluid collection. No mass lesion, mass effect, or midline shift.  No CT evidence of acute infarction.   Subcortical white matter and periventricular small vessel ischemic changes. Intracranial atherosclerosis.  Global cortical atrophy.  No ventriculomegaly.  The visualized paranasal sinuses are essentially clear. The mastoid air cells are unopacified.  No evidence of calvarial fracture.  IMPRESSION: No evidence of acute intracranial abnormality.  Atrophy with small vessel ischemic changes and intracranial atherosclerosis.   Electronically Signed   By: Julian Hy M.D.   On: 02/22/2014 12:53   Ct Chest Wo Contrast  02/22/2014   CLINICAL DATA:  Hypoxia and tachycardia.  EXAM: CT CHEST WITHOUT CONTRAST  TECHNIQUE: Multidetector CT imaging of the chest was performed following the standard protocol without IV contrast.  COMPARISON:  CT chest 02/01/2014 and 09/23/2012. Plain film of the chest earlier the same day and 01/29/2014.  FINDINGS: A large right pleural effusion is identified. Small right pleural effusion is also seen. There is no pericardial effusion. Heart size is upper normal with atrial enlargement. There is extensive aortic and coronary atherosclerotic vascular disease. No axillary, hilar or mediastinal lymphadenopathy is identified. 0.6 cm left upper lobe nodule is unchanged on image 19, unchanged since the 2014 exam. Compressive atelectasis on the right the lungs are otherwise clear. Instantly imaged upper abdomen demonstrates no focal abnormality.  IMPRESSION: Large right and very small left pleural effusion.  Cardiomegaly without evidence of pulmonary edema  Extensive atherosclerosis.  Unchanged 0.6 cm left upper lobe pulmonary nodule.   Electronically Signed   By: Inge Rise M.D.   On: 02/22/2014 13:00   Dg Chest Port 1 View  02/22/2014   CLINICAL DATA:  Hypotensive and tachycardic.  Shortness breath.  EXAM: PORTABLE CHEST - 1 VIEW  COMPARISON:  CT chest 02/01/2014  FINDINGS: The heart is enlarged. The patient is status post median sternotomy for aortic valve replacement. Right pleural  effusion has increased. Mild edema is worse on the right. The lung volumes are low. Minimal atelectasis is present at the left base. The visualized soft tissues and bony thorax are otherwise unremarkable.  IMPRESSION: 1. Scratch the persistent and likely progressive congestive heart failure. 2. Progressive right pleural effusion and asymmetric right-sided edema. 3. Low lung volumes.   Electronically Signed   By: Lawrence Santiago M.D.   On: 02/22/2014 12:02    Medications:  Scheduled: . antiseptic oral rinse  15 mL Mouth Rinse BID  . docusate sodium  100 mg Oral BID  . pantoprazole  40 mg Oral Daily  . polyethylene glycol  17 g Oral BID  . sodium phosphate  1 enema Rectal Once   Continuous:  FOY:DXAJOINOMVEHM, atropine, bisacodyl, diazepam, Gerhardt's butt cream, HYDROmorphone (DILAUDID) injection, ondansetron (ZOFRAN) IV, ondansetron  Assessment/Plan:  Active Problems:   Aortic stenosis   Anemia in chronic kidney disease(285.21)   Atrial fibrillation   Coronary atherosclerosis of native coronary artery   Essential hypertension, benign   Gout   End stage renal disease on dialysis   Lactic acidosis   Altered mental status   Septic shock    Hypotension with elevated  lactic acid, and decreased mentation This could be sepsis but remains unclear. Sepsis may be due to HCAP but source not clear. Family has chosen comfort care. BP remains low. PMT is following.   Constipation/Abdominal Bloating Will change to scheduled Miralax. Enema. Stool softeners.  Hypoglycemia, likely related to septic shock and resolved with dextrose fluids  Regular diet   Pleural effusion secondary to inadequate hemodialysis  Large right sided pleural effusion noted on CT. He does not seem overtly dyspneic at this time. Symptom management only at this time.  Lactic acidosis, likely secondary to hypotension Patient received antibiotics and IV fluids in the emergency department, however these were discontinued  per family request   End-stage renal disease, currently too hypotensive to undergo hemodialysis  No dialysis to be pursued. Hold off on consulting Nephrology.   CAD, HTN, HLD, and PVD  Holding medications due to low BP. Stable bilateral AKA   Acute congestive heart failure due to hypervolemia from inadequate HD  Family declined inotrope support. No ACEI/BB or HD due to hypotension   Atrial fibrillation, mild tachycardia  Hold anticoagulation due to supratherapeutic INR. Monitor heart rate only with routine vital signs. Warfarin has been discontinued.   Mild anemia of chronic kidney disease, a hemoglobin greater than 10, stable. Stable.  Gout Stable.   Access: PIV and left arm fistula   DVT Prophylaxis: Comfort Care Code Status: DNR, full comfort  Family Communication: No family at bedside today.   Disposition Plan: Await placement to residential hospice.    LOS: 2 days   [facility] Pager [phone] 02/24/2014, 11:15 AM  If 8PM-8AM, please contact night-coverage at [email or web address], password [email or web address]   Disclaimer: This note was dictated with voice recognition software. Similar sounding words can inadvertently be transcribed and may not be corrected upon review.

## 2014-02-24 NOTE — Discharge Summary (Signed)
[facility]  Physician Discharge Summary   Patient ID: Stephen Dyer MRN: [id number] DOB/AGE: 12-24-1945 68 y.o.  Admit date: 02/22/2014 Discharge date: 02/24/2014  PCP: MAST, MAN X, NP  DISCHARGE DIAGNOSES:  Active Problems:   Aortic stenosis   Anemia in chronic kidney disease(285.21)   Atrial fibrillation   Coronary atherosclerosis of native coronary artery   Essential hypertension, benign   Gout   End stage renal disease on dialysis   Lactic acidosis   Altered mental status   Septic shock   RECOMMENDATIONS FOR OUTPATIENT FOLLOW UP: 1. Patient has decided to stop hemodialysis 2. He is now comfort care  DISCHARGE CONDITION: poor  Diet recommendation: Comfort feeds  Filed Weights   02/23/14 0836 02/23/14 2057  Weight: 67.45 kg (148 lb 11.2 oz) 67.132 kg (148 lb)    INITIAL HISTORY: The patient is a 68 y.o. year-old male with history of CAD, CHF, hypertension, hyperlipidemia, atrial fibrillation on Coumadin anticoagulation, peripheral vascular disease status post above-the-knee amputations bilaterally, gout, end-stage renal disease on hemodialysis on Tuesday, Thursday, Saturday, glaucoma, PTSD who presented with decreased level of consciousness. He has not been able to tolerate hemodialysis secondary to low blood pressures. Because of his inability to tolerate hemodialysis, he has had recurrent accumulation of ascites and pleural effusions. He was hospitalized at [facility] recently. On day of admission he was found to have decreased level of consciousness at [facility] and his blood pressure was as low as the 40s/20s. He was awake and responsive and oriented x4. He was initially evaluated by PCCM but wife did not want aggressive measures and he was admitted to Hospitalists service with comfort care.  Consultations:  PCCM  Palliative Medicine  Procedures:  None  HOSPITAL COURSE:   Patient was admitted and initiated on comfort care. Palliative care consulted on  patient and discussed with family including wife. Residential hospice was recommended.   Other issues as below:  Hypotension with elevated lactic acid, and decreased mentation  This could be due to sepsis but remains unclear. Sepsis may be due to HCAP but source not clear. Family has chosen comfort care. BP remains low. Palliative care has seen patient.   Constipation/Abdominal Bloating  He was given scheduled Miralax. Enema can be given as needed. Stool softeners.   Hypoglycemia, likely related to septic shock and resolved with dextrose fluids  Continue comfort feeds  Pleural effusion secondary to inadequate hemodialysis  Large right sided pleural effusion noted on CT. He does not seem overtly dyspneic at this time. Symptom management only at this time.   Lactic acidosis, likely secondary to hypotension  Patient received antibiotics and IV fluids in the emergency department, however these were discontinued per family request   End-stage renal disease, currently too hypotensive to undergo hemodialysis  No dialysis to be pursued.    CAD, HTN, HLD, and PVD  We held medications due to low BP. Stable bilateral AKA   Acute congestive heart failure due to hypervolemia from inadequate HD  Family declined inotrope support. No ACEI/BB or HD due to hypotension   Atrial fibrillation, mild tachycardia  Warfarin has been discontinued.   Mild anemia of chronic kidney disease, a hemoglobin greater than 10, stable.  Stable.   Gout  Stable.   Code Status: DNR, full comfort care  Patient will be discharged to Residential [facility]ospice. [facility] .  PERTINENT LABS: The results of significant diagnostics from this hospitalization (including imaging, microbiology, ancillary and laboratory) are listed  below for reference.     Labs: Basic Metabolic Panel:  Recent Labs Lab 02/22/14 1122  NA 143  K 4.6  CL 92*  CO2 17*  GLUCOSE 22*  BUN 46*    CREATININE 4.83*  CALCIUM 10.7*   Liver Function Tests:  Recent Labs Lab 02/22/14 1122  AST 343*  ALT 161*  ALKPHOS 124*  BILITOT 1.5*  PROT 8.0  ALBUMIN 4.1    Recent Labs Lab 02/22/14 1122  AMMONIA 54   CBC:  Recent Labs Lab 02/22/14 1122  WBC 5.9  NEUTROABS 4.6  HGB 10.4*  HCT 34.3*  MCV 97.4  PLT 186   BNP: BNP (last 3 results)  Recent Labs  02/22/14 1122  PROBNP >70000.0*   CBG:  Recent Labs Lab 02/22/14 1226 02/22/14 1255 02/22/14 1355  GLUCAP 12* 85 108*     IMAGING STUDIES Ct Head Wo Contrast  02/22/2014   CLINICAL DATA:  Hypotensive, altered mental status  EXAM: CT HEAD WITHOUT CONTRAST  TECHNIQUE: Contiguous axial images were obtained from the base of the skull through the vertex without intravenous contrast.  COMPARISON:  None.  FINDINGS: No evidence of parenchymal hemorrhage or extra-axial fluid collection. No mass lesion, mass effect, or midline shift.  No CT evidence of acute infarction.  Subcortical white matter and periventricular small vessel ischemic changes. Intracranial atherosclerosis.  Global cortical atrophy.  No ventriculomegaly.  The visualized paranasal sinuses are essentially clear. The mastoid air cells are unopacified.  No evidence of calvarial fracture.  IMPRESSION: No evidence of acute intracranial abnormality.  Atrophy with small vessel ischemic changes and intracranial atherosclerosis.   Electronically Signed   By: Julian Hy M.D.   On: 02/22/2014 12:53   Ct Chest Wo Contrast  02/22/2014   CLINICAL DATA:  Hypoxia and tachycardia.  EXAM: CT CHEST WITHOUT CONTRAST  TECHNIQUE: Multidetector CT imaging of the chest was performed following the standard protocol without IV contrast.  COMPARISON:  CT chest 02/01/2014 and 09/23/2012. Plain film of the chest earlier the same day and 01/29/2014.  FINDINGS: A large right pleural effusion is identified. Small right pleural effusion is also seen. There is no pericardial effusion.  Heart size is upper normal with atrial enlargement. There is extensive aortic and coronary atherosclerotic vascular disease. No axillary, hilar or mediastinal lymphadenopathy is identified. 0.6 cm left upper lobe nodule is unchanged on image 19, unchanged since the 2014 exam. Compressive atelectasis on the right the lungs are otherwise clear. Instantly imaged upper abdomen demonstrates no focal abnormality.  IMPRESSION: Large right and very small left pleural effusion.  Cardiomegaly without evidence of pulmonary edema  Extensive atherosclerosis.  Unchanged 0.6 cm left upper lobe pulmonary nodule.   Electronically Signed   By: Inge Rise M.D.   On: 02/22/2014 13:00   Dg Chest Port 1 View  02/22/2014   CLINICAL DATA:  Hypotensive and tachycardic.  Shortness breath.  EXAM: PORTABLE CHEST - 1 VIEW  COMPARISON:  CT chest 02/01/2014  FINDINGS: The heart is enlarged. The patient is status post median sternotomy for aortic valve replacement. Right pleural effusion has increased. Mild edema is worse on the right. The lung volumes are low. Minimal atelectasis is present at the left base. The visualized soft tissues and bony thorax are otherwise unremarkable.  IMPRESSION: 1. Scratch the persistent and likely progressive congestive heart failure. 2. Progressive right pleural effusion and asymmetric right-sided edema. 3. Low lung volumes.   Electronically Signed   By: Bretta Bang.D.  On: 02/22/2014 12:02    DISCHARGE EXAMINATION: See progress note earlier today.  DISPOSITION: Residential Hospice  Discharge Instructions   Discharge diet:    Complete by:  As directed   Comfort feeds as tolerated           ALLERGIES:  Allergies  Allergen Reactions  . Atorvastatin     Unknown   . Gabapentin     Unknown   . Hectorol [Doxercalciferol]     Unknown   . Hydrochlorothiazide     Unknown   . Icodextrin [Dextrin]     Unknown   . Losartan     Unknown   . Niacin And Related     Unknown   . Nicotine      Unknown   . Phenytoin     Unknown   . Sensipar [Cinacalcet]     Unknown   . Sulfa Antibiotics     Unknown   . Vancomycin     Unknown   . Zetia [Ezetimibe]     Unknown     Current Discharge Medication List    START taking these medications   Details  atropine 1 % ophthalmic solution Place 2-4 drops under the tongue every 2 (two) hours as needed (upper airway secretions). Qty: 2 mL, Refills: 12    diazepam (VALIUM) 5 MG/ML injection Inject 0.5-1 mLs (2.5-5 mg total) into the vein every 4 (four) hours as needed. Qty: 2 mL    HYDROmorphone (DILAUDID) 1 MG/ML SOLN injection Inject 1-2 mLs (1-2 mg total) into the vein every hour as needed for moderate pain or severe pain (Dyspnea). Refills: 0    ondansetron (ZOFRAN) 4 MG/2ML SOLN injection Inject 2 mLs (4 mg total) into the vein every 6 (six) hours as needed for nausea. Qty: 2 mL, Refills: 0    polyethylene glycol (MIRALAX / GLYCOLAX) packet Take 17 g by mouth 2 (two) times daily. Qty: 14 each, Refills: 0      CONTINUE these medications which have NOT CHANGED   Details  acetaminophen (TYLENOL) 325 MG tablet Take 325 mg by mouth every 6 (six) hours as needed (for pain).    bisacodyl (DULCOLAX) 5 MG EC tablet Take 5 mg by mouth daily as needed for moderate constipation.    docusate sodium (COLACE) 100 MG capsule Take 100 mg by mouth 2 (two) times daily.    pantoprazole (PROTONIX) 40 MG tablet Take 40 mg by mouth daily.      STOP taking these medications     cephALEXin (KEFLEX) 500 MG capsule      Folic Acid-Vit T5-VVO H60 (FOLBEE) 2.5-25-1 MG TABS tablet      lanthanum (FOSRENOL) 500 MG chewable tablet      megestrol (MEGACE) 40 MG/ML suspension      midodrine (PROAMATINE) 10 MG tablet      ondansetron (ZOFRAN) 4 MG tablet      pravastatin (PRAVACHOL) 40 MG tablet      traMADol (ULTRAM) 50 MG tablet      warfarin (COUMADIN) 4 MG tablet        Follow-up Information   Schedule an appointment as soon as  possible for a visit with MAST, MAN X, NP. (As needed)    Specialty:  Nurse Practitioner   Contact information:   [address] [address] [phone]       TOTAL DISCHARGE TIME: 35 mins  Bonnielee Haff  [facility] Pager [phone]  02/24/2014, 5:23 PM  Disclaimer: This  note was dictated with voice recognition software. Similar sounding words can inadvertently be transcribed and may not be corrected upon review.

## 2014-02-24 NOTE — Progress Notes (Signed)
Chaplain responded to spiritual care consult from Dr. Hilma Favors. Chaplain offered emotional support to pt, who was eating breakfast. Consulted with pt's RN and asked that chaplain services be offered when family arrives. Available for follow up, please page if needed.   Ethelene Browns [phone]

## 2014-02-24 NOTE — Consult Note (Signed)
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 male admitted with AMS in setting of multiple end stage chronic medical problems. His past few months have been extremely difficult- at [facility] where he has been receiving outpatient HD after having bilateral lower extremity amputation and severe pain from PVD and DM neuropathy. PMT consulted for goals of care after patient stated he desired comfort care and to discontinue hemodialysis.  Participants in Discussion: Patient and his wife.  Goals/Summary of Case:   Advance Directive: none on file   Code Status Orders        Start     Ordered   02/22/14 1748  Do not attempt resuscitation (DNR)   Continuous    Question Answer Comment  Maintain current active treatments Yes   Do not initiate new interventions Yes      02/22/14 1747       I have reviewed the medical record, interviewed the patient and family, and examined the patient. The following aspects are pertinent.  Past Medical History  Diagnosis Date  . Aortic stenosis 04/07/2013  . Atherosclerotic peripheral vascular disease with gangrene 04/07/2013  . Atrial fibrillation 04/21/2013  . Coronary atherosclerosis of native coronary artery 07/19/2013  . Acute lower gastrointestinal bleeding 07/19/2013  . Acute posthemorrhagic anemia 07/02/2013  . 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 Pure hypercholesterolemia 04/07/2013  . Essential  hypertension, benign 03/10/2013  . Gout 03/10/2013  . End stage renal disease on dialysis 03/10/2013  . Prostate cancer 03/10/2013  . PVD (peripheral vascular disease) 03/10/2013  . Personal history of colonic polyps 03/10/2013  . Glaucoma 03/10/2013  . Post traumatic stress disorder (PTSD) 03/10/2013    History   Social History  . Marital Status: Married    Spouse Name: N/A    Number of Children: N/A  . Years of Education: N/A   Social History Main Topics  . Smoking status: Former Research scientist (life sciences)  . Smokeless tobacco: Current User  . Alcohol Use: No  . Drug Use: No  . Sexual Activity: None   Other Topics Concern  . None   Social History Narrative   [employer], HD on T,H,Sa    Family History  Problem Relation Age of Onset  . CAD    . Kidney disease       Scheduled Meds: . antiseptic oral rinse  15 mL Mouth Rinse BID  . docusate sodium  100 mg Oral BID  . pantoprazole  40 mg Oral Daily  . polyethylene glycol  17 g Oral BID  . sodium phosphate  1 enema Rectal Once   Continuous Infusions:  PRN Meds:.acetaminophen, atropine, bisacodyl, diazepam, Gerhardt's butt cream, HYDROmorphone (DILAUDID) injection, ondansetron (ZOFRAN) IV, ondansetron Allergies  Allergen Reactions  . Atorvastatin     Unknown   . Gabapentin     Unknown   . Hectorol [Doxercalciferol]     Unknown   . Hydrochlorothiazide  Unknown   . Icodextrin [Dextrin]     Unknown   . Losartan     Unknown   . Niacin And Related     Unknown   . Nicotine     Unknown   . Phenytoin     Unknown   . Sensipar [Cinacalcet]     Unknown   . Sulfa Antibiotics     Unknown   . Vancomycin     Unknown   . Zetia [Ezetimibe]     Unknown    CBC:    Component Value Date/Time   WBC 5.9 02/22/2014 1122   HGB 10.4* 02/22/2014 1122   HCT 34.3* 02/22/2014 1122   PLT 186 02/22/2014 1122   MCV 97.4 02/22/2014 1122   NEUTROABS 4.6 02/22/2014 1122   LYMPHSABS 0.5* 02/22/2014 1122   MONOABS 0.8 02/22/2014 1122   EOSABS 0.0 02/22/2014 1122    BASOSABS 0.0 02/22/2014 1122    Comprehensive Metabolic Panel:    Component Value Date/Time   NA 143 02/22/2014 1122   K 4.6 02/22/2014 1122   CL 92* 02/22/2014 1122   CO2 17* 02/22/2014 1122   BUN 46* 02/22/2014 1122   CREATININE 4.83* 02/22/2014 1122   GLUCOSE 22* 02/22/2014 1122   CALCIUM 10.7* 02/22/2014 1122   AST 343* 02/22/2014 1122   ALT 161* 02/22/2014 1122   ALKPHOS 124* 02/22/2014 1122   BILITOT 1.5* 02/22/2014 1122   PROT 8.0 02/22/2014 1122   ALBUMIN 4.1 02/22/2014 1122    Vital Signs: BP 91/62  Pulse 99  Temp(Src) 98 F (36.7 C) (Oral)  Resp 19  Ht 6' (1.829 m)  Wt 67.132 kg (148 lb)  BMI 20.07 kg/m2  SpO2 95% Filed Weights   02/23/14 0836 02/23/14 2057  Weight: 67.45 kg (148 lb 11.2 oz) 67.132 kg (148 lb)    Physical Exam:  General appearance: NAD, minimally conversant, chronically ill appearing Eyes: anicteric sclerae, moist conjunctivae; no lid-lag; PERRLA HENT: poor dentition Neck: Trachea midline; FROM, supple, no thyromegaly or lymphadenopathy Lungs:crakles and diminished in bases CV: Tacycardic  Abdomen: Soft, non-tender; no masses or HSM Extremities: bilateral AKAs Skin: dry, multiple ulcerations, sacral wound  Psych: slightly confused but aware of conversation and clear on his desire for comfort   Assessment/Prognosis: Primary Diagnoses  ESRD, stopping HD  Active Symptoms: Severe pain, phantom pain in his limbs Dyspnea Anxiety  Prognosis  PPS 30   Scope of Treatment / recommendations:  Full comfort  No additional HD, patient extremely hypotensive and debilitated.  Refer to [facility] and [facility]-they are from [place], [place]  Prognosis: <10 days  Started PRN Hydromorphone, Valium, Atropine Gtt, and topical barrier cream   Patient and his wife report being very spiritual and at peace with the decision to seek comfort and hospice care.  ROS:  Unable to obtain complete ROS due to patient factors. Other than symptoms attributable  to the present illness and past history as described above, general review of systems is otherwise negative.    Time: 50 minutes 4:20PM-5:30-PM Greater than 50%  of this time was spent counseling and coordinating care related to the above assessment and plan.  Signed by: Acquanetta Chain, DO  Acquanetta Chain, DO  02/24/2014, 5:30 PM  Please contact Palliative Medicine Team phone at [phone] for questions and concerns.

## 2014-02-26 NOTE — Progress Notes (Signed)
Patient ID: Stephen Dyer, male   DOB: 1946/07/07, 68 y.o.   MRN: [id number]                 PROGRESS NOTE       DATE: 02/22/2014        FACILITY: [facility]  LEVEL OF CARE: SNF (31)  Acute Visit     ALLERGIES:  Allergies  Allergen Reactions  . Atorvastatin     Unknown   . Gabapentin     Unknown   . Hectorol [Doxercalciferol]     Unknown   . Hydrochlorothiazide     Unknown   . Icodextrin [Dextrin]     Unknown   . Losartan     Unknown   . Niacin And Related     Unknown   . Nicotine     Unknown   . Phenytoin     Unknown   . Sensipar [Cinacalcet]     Unknown   . Sulfa Antibiotics     Unknown   . Vancomycin     Unknown   . Zetia [Ezetimibe]     Unknown     CHIEF COMPLAINT:  Manage altered mental status.    HISTORY OF PRESENT ILLNESS:  Staff report that patient is extremely lethargic and disoriented this morning and he is not responding to commands coherently.  Patient is lethargic.  Opens his eyes to verbal commands, but does not respond to questions.  Staff cannot identify precipitating or alleviating factors.    PAST MEDICAL HISTORY :  Past Medical History  Diagnosis Date  . Aortic stenosis 04/07/2013  . Atherosclerotic peripheral vascular disease with gangrene 04/07/2013  . Atrial fibrillation 04/21/2013  . Coronary atherosclerosis of native coronary artery 07/19/2013  . Acute lower gastrointestinal bleeding 07/19/2013  . Acute posthemorrhagic anemia 07/02/2013  . 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 Pure hypercholesterolemia 04/07/2013  . Essential hypertension, benign 03/10/2013  . Gout 03/10/2013  . End stage renal disease on dialysis 03/10/2013  . Prostate cancer 03/10/2013  . PVD (peripheral vascular disease) 03/10/2013  . Personal history of colonic polyps 03/10/2013  . Glaucoma 03/10/2013  . Post traumatic stress disorder (PTSD) 03/10/2013    PAST SURGICAL HISTORY: Past Surgical History  Procedure Laterality Date  .  Aortic valve replacement    . Leg amputation above knee Bilateral     left then right  . Debriderment of aka Left   . Rij      tunneled     SOCIAL HISTORY:  reports that he has quit smoking. He uses smokeless tobacco. He reports that he does not drink alcohol or use illicit drugs.  FAMILY HISTORY:  Family History  Problem Relation Age of Onset  . CAD    . Kidney disease      CURRENT MEDICATIONS: Reviewed per MAR/see medication list  REVIEW OF SYSTEMS:  Unobtainable due to altered mental status.    PHYSICAL EXAMINATION  VS:  T        P 76      RR 20      BP 88/59           GENERAL: no acute distress, normal body habitus EYES: unable to assess    MOUTH/THROAT: unable to assess     NECK: supple, trachea midline, no neck masses, no thyroid tenderness, no thyromegaly LYMPHATICS: no LAN in the neck, no supraclavicular LAN RESPIRATORY: breathing is even & unlabored, BS decreased CARDIAC: heart rate is irregular  irregular, no murmur,no extra heart sounds, no edema GI:  ABDOMEN: abdomen soft, normal BS, no masses, no tenderness  LIVER/SPLEEN: no hepatomegaly, no splenomegaly MUSCULOSKELETAL: HEAD: normal to inspection  EXTREMITIES: LEFT UPPER EXTREMITY: Unable to assess RIGHT UPPER EXTREMITY:  Unable to assess LEFT LOWER EXTREMITY: he has AKA    RIGHT LOWER EXTREMITY: he has AKA    PSYCHIATRIC: the patient is minimally alert, unable to assess orientation, depressed affect NEUROLOGICAL:   The patient is lethargic and does not follow commands.    LABS/RADIOLOGY:  Labs reviewed: Basic Metabolic Panel:  Recent Labs  02/22/14 1122  NA 143  K 4.6  CL 92*  CO2 17*  GLUCOSE 22*  BUN 46*  CREATININE 4.83*  CALCIUM 10.7*   Liver Function Tests:  Recent Labs  02/22/14 1122  AST 343*  ALT 161*  ALKPHOS 124*  BILITOT 1.5*  PROT 8.0  ALBUMIN 4.1    Recent Labs  02/22/14 1122  AMMONIA 54   CBC:  Recent Labs  02/22/14 1122  WBC 5.9  NEUTROABS 4.6  HGB  10.4*  HCT 34.3*  MCV 97.4  PLT 186   CBG:  Recent Labs  02/22/14 1226 02/22/14 1255 02/22/14 1355  GLUCAP 12* 85 108*     ASSESSMENT/PLAN:  Altered mental status.  New onset.  Significant problem.  Patient is hypotensive, as well.  We will send to the emergency room for acute evaluation and management.  He will need a stat CBC with diff, CMP, chest x-ray, IV fluids.    I have reviewed patient's medical records received at admission/from hospitalization.  CPT CODE: 46962       Gayani Y Dasanayaka, [place] [phone]

## 2014-03-22 DEATH — deceased

## 2015-03-03 IMAGING — CT CT CHEST W/O CM
2 of 4 series · 15 of 36 positions shown, 18 images · non-contrast
Comparison: CT chest 02/01/2014 and 09/23/2012. Plain film of the
chest earlier the same day and 01/29/2014.

CLINICAL DATA: Hypoxia and tachycardia.

EXAM:
CT CHEST WITHOUT CONTRAST
TECHNIQUE: Multidetector CT imaging of the chest was performed following the
standard protocol without IV contrast..

[Series 2: thorax 5.0 i31f 1 · axial · 0.81mm/px · z∈[+839,+1049]mm · 12 of 48 slices shown, 15 images]
[im 3/48  mediastinal]
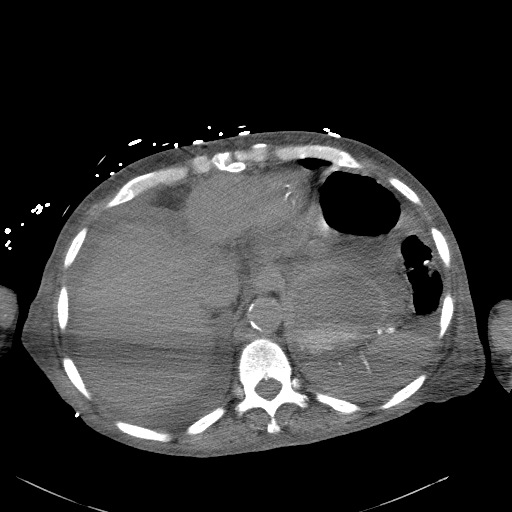
[im 3/48  lung]
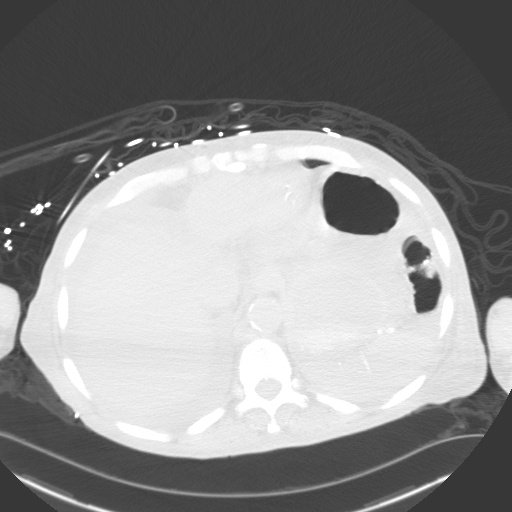
[im 8/48  lung]
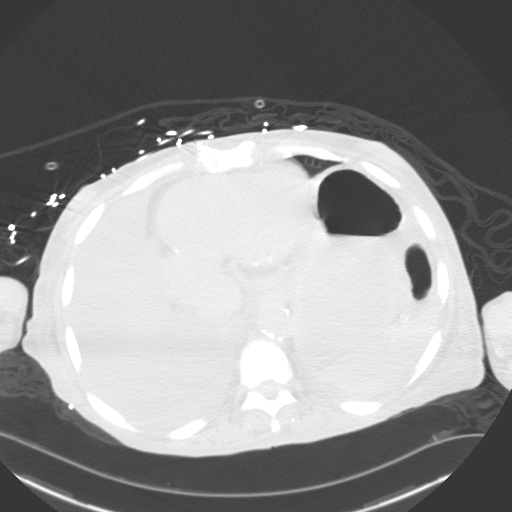
[im 10/48  lung]
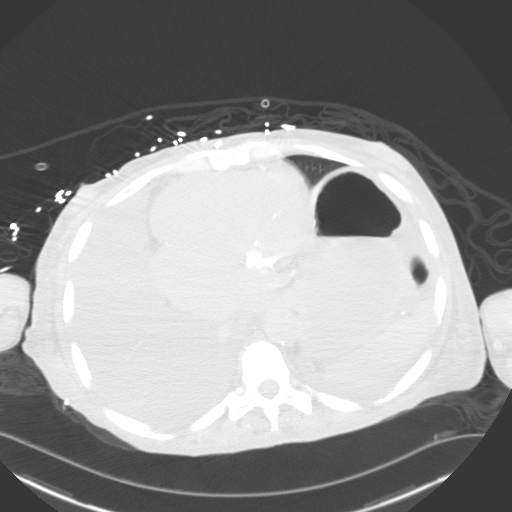
[im 15/48  lung]
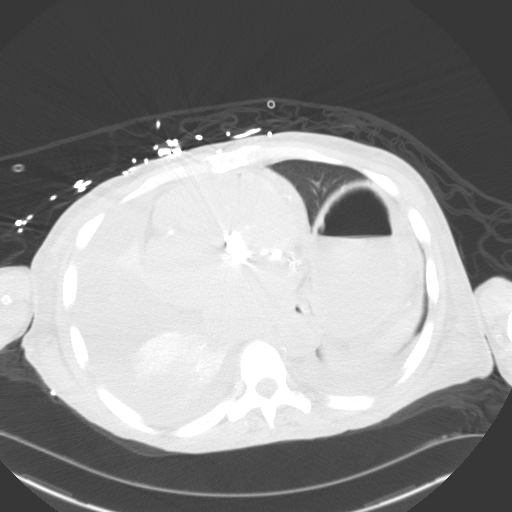
[im 18/48  mediastinal]
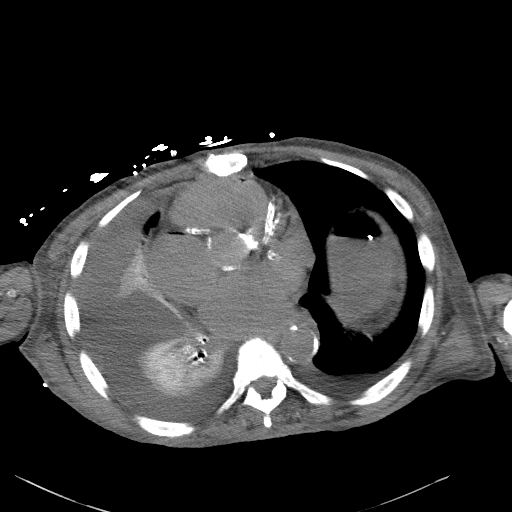
[im 18/48  lung]
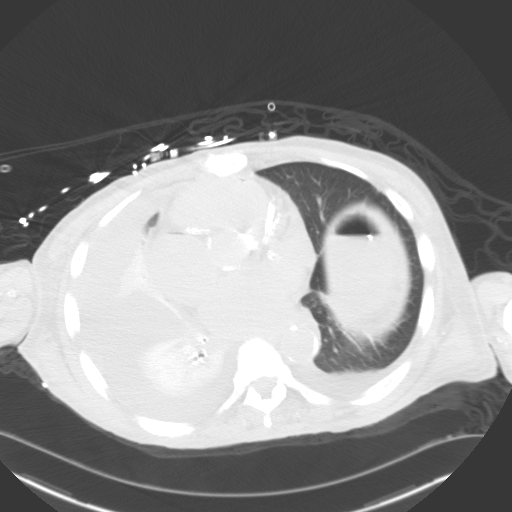
[im 23/48  lung]
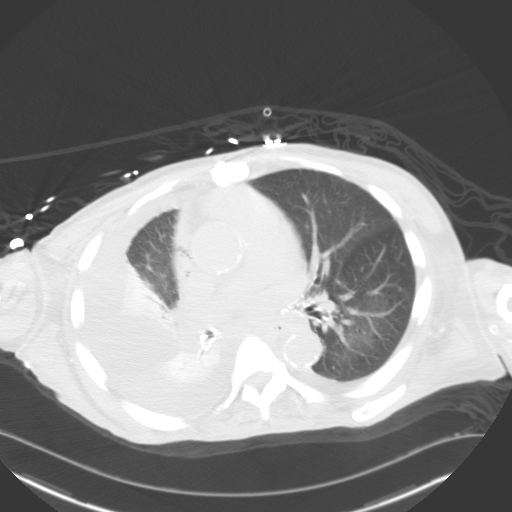
[im 25/48  lung]
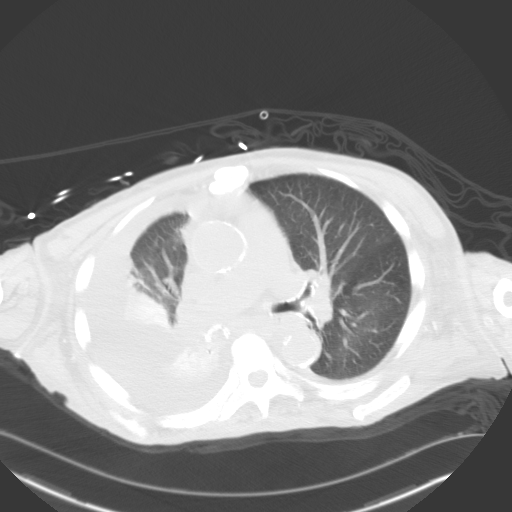
[im 30/48  lung]
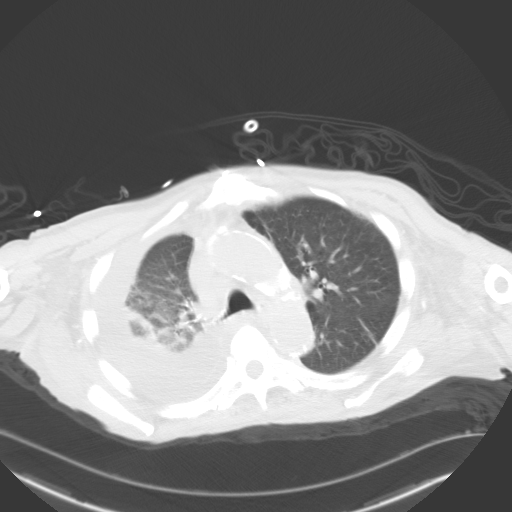
[im 33/48  mediastinal]
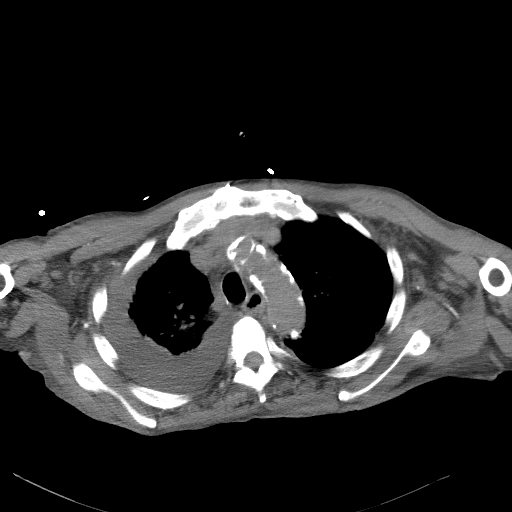
[im 33/48  lung]
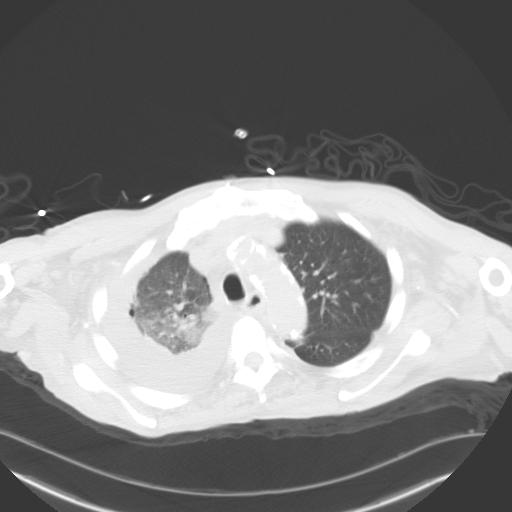
[im 38/48  lung]
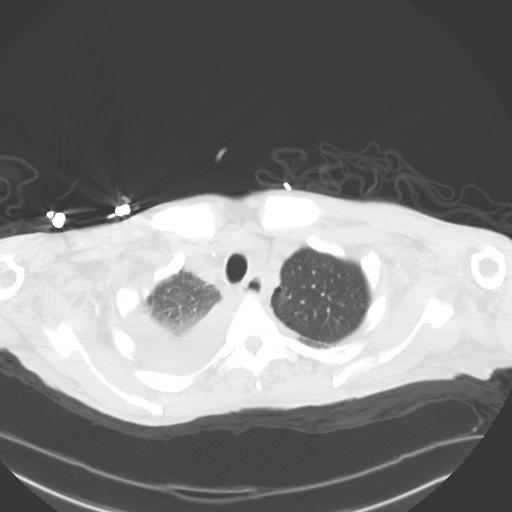
[im 40/48  lung]
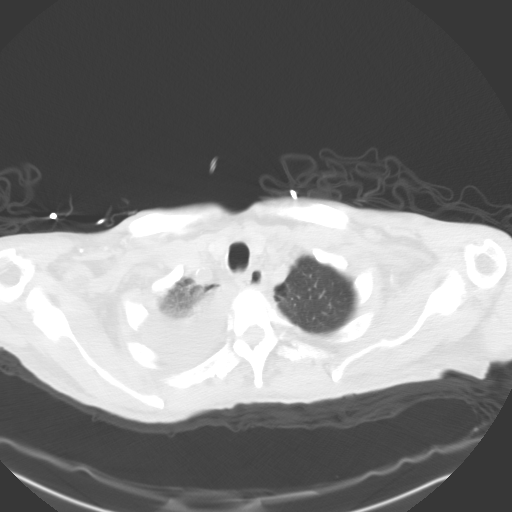
[im 45/48  lung]
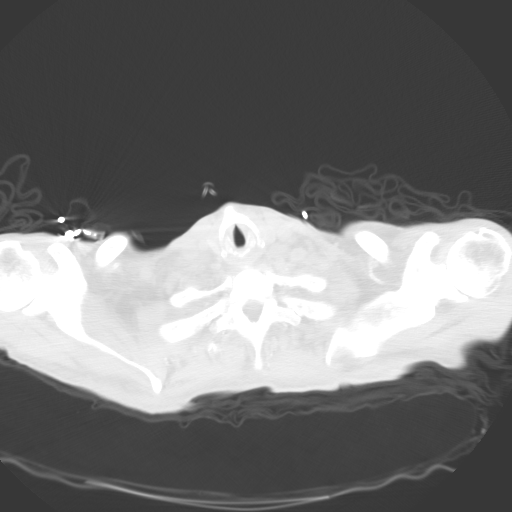

[Series 5: coronal · coronal · 0.51mm/px · 3 of 72 slices shown]
[im 15/72  lung]
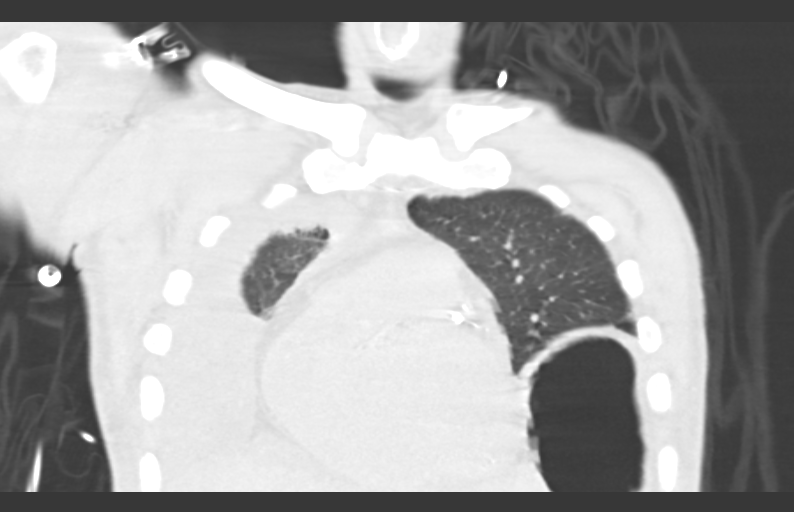
[im 29/72  lung]
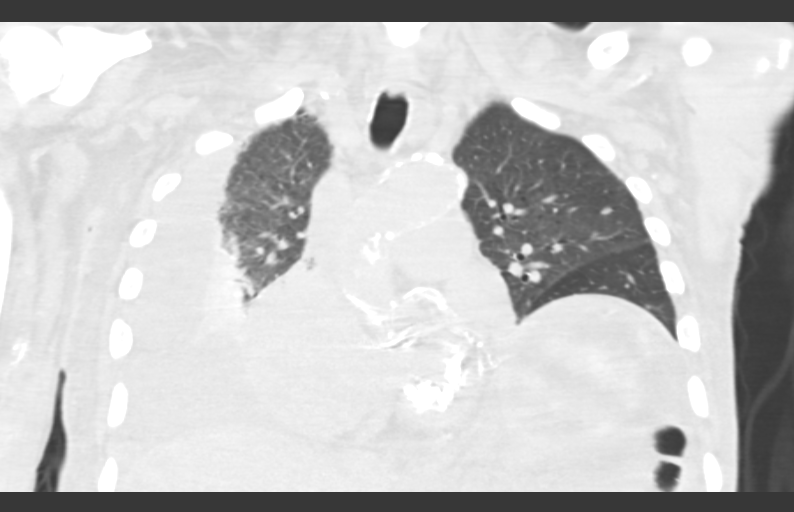
[im 43/72  lung]
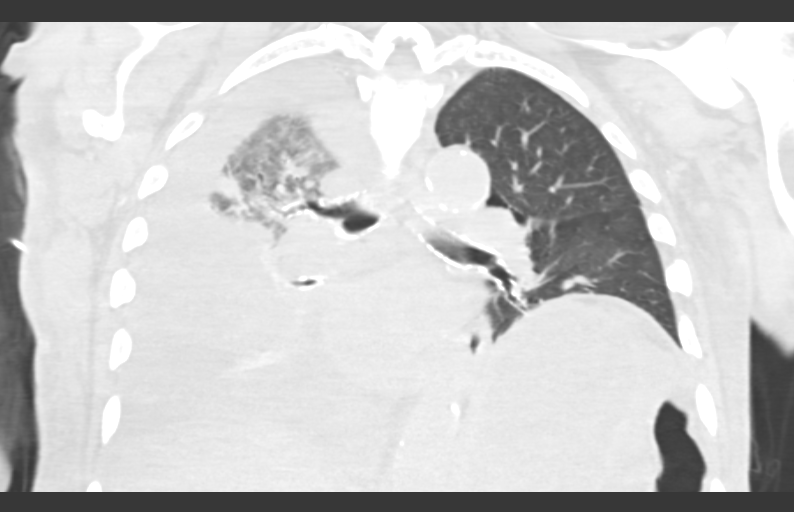

[15 of 36 positions shown; findings below may reference images not displayed]

FINDINGS: A large right pleural effusion is identified. Small right pleural
effusion is also seen. There is no pericardial effusion. Heart size
is upper normal with atrial enlargement. There is extensive aortic
and coronary atherosclerotic vascular disease. No axillary, hilar or
mediastinal lymphadenopathy is identified. 0.6 cm left upper lobe
Compressive atelectasis on the right the lungs are otherwise clear.
Instantly imaged upper abdomen demonstrates no focal abnormality.
IMPRESSION: Large right and very small left pleural effusion.

Cardiomegaly without evidence of pulmonary edema

Extensive atherosclerosis.

Unchanged 0.6 cm left upper lobe pulmonary nodule.
